# Patient Record
Sex: Female | Born: 1935 | Race: White | Hispanic: No | Marital: Married | Smoking: Never smoker
Health system: Southern US, Community
[De-identification: ages and names within clinical notes are randomized; demographics above are authoritative.]

## PROBLEM LIST (undated history)

## (undated) DIAGNOSIS — I10 Essential (primary) hypertension: Secondary | ICD-10-CM

## (undated) DIAGNOSIS — C50919 Malignant neoplasm of unspecified site of unspecified female breast: Secondary | ICD-10-CM

## (undated) DIAGNOSIS — E039 Hypothyroidism, unspecified: Secondary | ICD-10-CM

## (undated) DIAGNOSIS — C140 Malignant neoplasm of pharynx, unspecified: Secondary | ICD-10-CM

## (undated) DIAGNOSIS — C3490 Malignant neoplasm of unspecified part of unspecified bronchus or lung: Secondary | ICD-10-CM

## (undated) HISTORY — PX: MASTECTOMY: SHX3

## (undated) HISTORY — PX: BACK SURGERY: SHX140

## (undated) HISTORY — PX: PORTACATH PLACEMENT: SHX2246

## (undated) HISTORY — PX: OTHER SURGICAL HISTORY: SHX169

## (undated) HISTORY — PX: PORT-A-CATH REMOVAL: SHX5289

## (undated) HISTORY — DX: Malignant neoplasm of unspecified site of unspecified female breast: C50.919

## (undated) HISTORY — DX: Essential (primary) hypertension: I10

## (undated) HISTORY — DX: Malignant neoplasm of unspecified part of unspecified bronchus or lung: C34.90

## (undated) HISTORY — DX: Malignant neoplasm of pharynx, unspecified: C14.0

## (undated) HISTORY — DX: Hypothyroidism, unspecified: E03.9

---

## 2008-12-25 DIAGNOSIS — C140 Malignant neoplasm of pharynx, unspecified: Secondary | ICD-10-CM

## 2008-12-25 DIAGNOSIS — C50919 Malignant neoplasm of unspecified site of unspecified female breast: Secondary | ICD-10-CM

## 2008-12-25 HISTORY — DX: Malignant neoplasm of pharynx, unspecified: C14.0

## 2008-12-25 HISTORY — DX: Malignant neoplasm of unspecified site of unspecified female breast: C50.919

## 2010-12-25 DIAGNOSIS — C3490 Malignant neoplasm of unspecified part of unspecified bronchus or lung: Secondary | ICD-10-CM

## 2010-12-25 HISTORY — DX: Malignant neoplasm of unspecified part of unspecified bronchus or lung: C34.90

## 2013-07-15 ENCOUNTER — Ambulatory Visit (INDEPENDENT_AMBULATORY_CARE_PROVIDER_SITE_OTHER): Payer: Medicare Other | Admitting: Family Medicine

## 2013-07-15 ENCOUNTER — Ambulatory Visit: Payer: Self-pay | Admitting: Family Medicine

## 2013-07-15 ENCOUNTER — Encounter: Payer: Self-pay | Admitting: Family Medicine

## 2013-07-15 VITALS — BP 168/74 | HR 80 | Temp 98.7°F | Ht 63.5 in | Wt 101.2 lb

## 2013-07-15 DIAGNOSIS — C50919 Malignant neoplasm of unspecified site of unspecified female breast: Secondary | ICD-10-CM

## 2013-07-15 DIAGNOSIS — C50912 Malignant neoplasm of unspecified site of left female breast: Secondary | ICD-10-CM | POA: Insufficient documentation

## 2013-07-15 DIAGNOSIS — E039 Hypothyroidism, unspecified: Secondary | ICD-10-CM

## 2013-07-15 DIAGNOSIS — Z85819 Personal history of malignant neoplasm of unspecified site of lip, oral cavity, and pharynx: Secondary | ICD-10-CM

## 2013-07-15 DIAGNOSIS — Z853 Personal history of malignant neoplasm of breast: Secondary | ICD-10-CM

## 2013-07-15 DIAGNOSIS — C140 Malignant neoplasm of pharynx, unspecified: Secondary | ICD-10-CM

## 2013-07-15 DIAGNOSIS — I10 Essential (primary) hypertension: Secondary | ICD-10-CM | POA: Insufficient documentation

## 2013-07-15 MED ORDER — OXYCODONE HCL 5 MG/5ML PO SOLN: 5.0000 mg | ORAL | Status: DC | PRN

## 2013-07-15 NOTE — Progress Notes (Signed)
  Subjective:    Patient ID: Veronica Pierce, female    DOB: [birth year], 79 y.o.   MRN: [id number]  HPI  Very pleasant 79 yo female here to establish care with her daughter.  Recently moved here from [facility] with her husband to be closer to her daughter and grandchildren.  Cancer survivor- has had multiple cancers unfortunately but doing very well. In 2010, diagnosed with throat cancer.  Had 8 chemo treatments and 35 radiation treatments.  Did require PEG tub for short time.  Later in 2010, diagnosed with left breast CA- s/p mastectomy in 08/2009.  In 2011, "spots on her right lung" were found.  S.p 5 tx of radiation on her lung.  Was on Femara (remains on femara)- 2013- right breast CA, s/p right mastectomy.  Hypothyroidism- secondary to radiation- on synthroid 100 mcg daily.  Denies any symptoms of hypo or hyperthyroidism.  Last saw ENT in 03/2013.  BP elevated today- per pt, typically runs in 140s/80s.  Denies any HA, blurred vision, CP or SOB.  Has lost a few pounds with the stress of the move- has been maintaining weight around 107 lb.  Does drink ensure several times per day.  Wt Readings from Last 3 Encounters:  07/15/13 101 lb 4 oz (45.927 kg)   Patient Active Problem List   Diagnosis Date Noted  . Throat cancer   . Breast cancer   . Hypothyroidism   . HTN (hypertension)    Past Medical History  Diagnosis Date  . Throat cancer 2010  . Breast cancer 2010    left   . Lung cancer 2012  . Breast cancer 2013    right  . Hypothyroidism   . HTN (hypertension)    Past Surgical History  Procedure Laterality Date  . Mastectomy Bilateral     2010, 2013   History  Substance Use Topics  . Smoking status: Never Smoker   . Smokeless tobacco: Never Used  . Alcohol Use: No   Family History  Problem Relation Age of Onset  . Diabetes Mother   . Arthritis Father    No Known Allergies No current outpatient prescriptions on file prior to visit.   No current  facility-administered medications on file prior to visit.   The PMH, PSH, Social History, Family History, Medications, and allergies have been reviewed in [facility], and have been updated if relevant.   Review of Systems See HPI Denies any anxiety or depression- feels she is adjusting well No nausea, vomiting or diarrhea    Objective:   Physical Exam BP 168/74  Pulse 80  Temp(Src) 98.7 F (37.1 C) (Oral)  Ht 5' 3.5" (1.613 m)  Wt 101 lb 4 oz (45.927 kg)  BMI 17.65 kg/m2 Gen:  Alert, thin female in NAD Chest:  S/p bilateral mastectomy Resp:  CTA bilaterally CVS:  RRR Abd:  Soft, NT Ext:  No edema Psych:  Good eye contact.  Not anxious or depressed appearing.     Assessment & Plan:  1. History of throat cancer Has been doing well. Will refer to local ENT. - Ambulatory referral to Hematology / Oncology - Ambulatory referral to ENT  2. History of breast cancer Refer to Oncology. - Ambulatory referral to Hematology / Oncology  3. Throat cancer   4. Hypothyroidism Asymptomatic but has not had thyroid function checked in several months.  Will recheck today. - TSH - T4, Free  5. HTN (hypertension) Continue to monitor- likely white coat HTN.

## 2013-07-15 NOTE — Patient Instructions (Addendum)
It was nice to meet you.  Please stop by to see Shirlee Limerick on your way out to set up your referrals. We will call you with your thyroid lab results.

## 2013-07-16 LAB — TSH: TSH: 3.02 u[IU]/mL (ref 0.35–5.50)

## 2013-07-28 ENCOUNTER — Telehealth: Payer: Self-pay | Admitting: *Deleted

## 2013-07-28 NOTE — Telephone Encounter (Signed)
Pt's daughter Eunice Blase) called stating that her mom has moved here and she would like to establish care with Dr. Welton Flakes.  Informed her that we need to obtain records from her previous oncologist and she gave me that facilities information.  I told her that I would call and see what I could do and get back with her.   Called and left a message for Elease Hashimoto in Medical Records at [facility] at [phone] requesting for her to please fax records and if she had any questions, to please call me.  Called Pts daughter Eunice Blase) back to make her aware.

## 2013-07-29 ENCOUNTER — Telehealth: Payer: Self-pay | Admitting: *Deleted

## 2013-07-29 NOTE — Telephone Encounter (Signed)
Received records and spoke with pt's daughter Eunice Blase) and confirmed 08/22/13 appt w/ her.  Mailed before appt letter & packet to pt.  Took paperwork to Med Rec for chart.

## 2013-08-22 ENCOUNTER — Ambulatory Visit (HOSPITAL_BASED_OUTPATIENT_CLINIC_OR_DEPARTMENT_OTHER): Payer: Medicare Other | Admitting: Oncology

## 2013-08-22 ENCOUNTER — Ambulatory Visit: Payer: Medicare Other

## 2013-08-22 ENCOUNTER — Other Ambulatory Visit (HOSPITAL_BASED_OUTPATIENT_CLINIC_OR_DEPARTMENT_OTHER): Payer: Medicare Other

## 2013-08-22 ENCOUNTER — Other Ambulatory Visit: Payer: Self-pay | Admitting: Emergency Medicine

## 2013-08-22 ENCOUNTER — Encounter: Payer: Self-pay | Admitting: Oncology

## 2013-08-22 ENCOUNTER — Other Ambulatory Visit: Payer: Self-pay | Admitting: *Deleted

## 2013-08-22 ENCOUNTER — Telehealth: Payer: Self-pay | Admitting: *Deleted

## 2013-08-22 VITALS — BP 186/72 | HR 90 | Temp 97.8°F | Resp 20 | Ht 63.5 in | Wt 102.8 lb

## 2013-08-22 DIAGNOSIS — C50911 Malignant neoplasm of unspecified site of right female breast: Secondary | ICD-10-CM

## 2013-08-22 DIAGNOSIS — C50919 Malignant neoplasm of unspecified site of unspecified female breast: Secondary | ICD-10-CM

## 2013-08-22 DIAGNOSIS — E559 Vitamin D deficiency, unspecified: Secondary | ICD-10-CM

## 2013-08-22 DIAGNOSIS — C50411 Malignant neoplasm of upper-outer quadrant of right female breast: Secondary | ICD-10-CM

## 2013-08-22 DIAGNOSIS — C50419 Malignant neoplasm of upper-outer quadrant of unspecified female breast: Secondary | ICD-10-CM

## 2013-08-22 DIAGNOSIS — M810 Age-related osteoporosis without current pathological fracture: Secondary | ICD-10-CM

## 2013-08-22 DIAGNOSIS — Z170 Estrogen receptor positive status [ER+]: Secondary | ICD-10-CM

## 2013-08-22 DIAGNOSIS — Z85819 Personal history of malignant neoplasm of unspecified site of lip, oral cavity, and pharynx: Secondary | ICD-10-CM

## 2013-08-22 LAB — CBC WITH DIFFERENTIAL/PLATELET
Basophils Absolute: 0 10*3/uL (ref 0.0–0.1)
EOS%: 1.8 % (ref 0.0–7.0)
HGB: 13.1 g/dL (ref 11.6–15.9)
MCH: 31.2 pg (ref 25.1–34.0)
MCV: 93.6 fL (ref 79.5–101.0)
MONO%: 6.3 % (ref 0.0–14.0)
RBC: 4.21 10*6/uL (ref 3.70–5.45)
RDW: 13.3 % (ref 11.2–14.5)

## 2013-08-22 LAB — COMPREHENSIVE METABOLIC PANEL (CC13)
ALT: 12 U/L (ref 0–55)
CO2: 25 mEq/L (ref 22–29)
Calcium: 9.5 mg/dL (ref 8.4–10.4)
Glucose: 182 mg/dl — ABNORMAL HIGH (ref 70–140)
Potassium: 4.2 mEq/L (ref 3.5–5.1)
Total Protein: 7.1 g/dL (ref 6.4–8.3)

## 2013-08-22 MED ORDER — ALENDRONATE SODIUM 35 MG PO TABS: 35.0000 mg | ORAL_TABLET | ORAL | Status: DC

## 2013-08-22 NOTE — Patient Instructions (Addendum)
Doing well  Continue letrozole 2.5 mg daily  Fosamax once a week  Alendronate weekly tablets What is this medicine? ALENDRONATE (a LEN droe nate) slows calcium loss from bones. It helps to make healthy bone and to slow bone loss in people with osteoporosis. It may be used to treat Paget's disease. This medicine may be used for other purposes; ask your health care provider or pharmacist if you have questions. What should I tell my health care provider before I take this medicine? They need to know if you have any of these conditions: -esophagus, stomach, or intestine problems, like acid-reflux or GERD -dental disease -kidney disease -low blood calcium -low vitamin D -problems swallowing -problems sitting or standing for 30 minutes -an unusual or allergic reaction to alendronate, other medicines, foods, dyes, or preservatives -pregnant or trying to get pregnant -breast-feeding How should I use this medicine? You must take this medicine exactly as directed or you will lower the amount of medicine you absorb into your body or you may cause yourself harm. Take your dose by mouth first thing in the morning, after you are up for the day. Do not eat or drink anything before you take this medicine. Swallow your medicine with a full glass (6 to 8 fluid ounces) of plain water. Do not take this tablet with any other drink. Do not chew or crush the tablet. After taking this medicine, do not eat breakfast, drink, or take any medicines or vitamins for at least 30 minutes. Stand or sit up for at least 30 minutes after you take this medicine; do not lie down. Take this medicine on the same day every week. Do not take your medicine more often than directed. Talk to your pediatrician regarding the use of this medicine in children. Special care may be needed. Overdosage: If you think you have taken too much of this medicine contact a poison control center or emergency room at once. NOTE: This medicine is only  for you. Do not share this medicine with others. What if I miss a dose? If you miss a dose, take the dose on the morning after you remember. Then take your next dose on your regular day of the week. Never take 2 tablets on the same day. Do not take double or extra doses. What may interact with this medicine? -aluminum hydroxide -antacids -aspirin -calcium supplements -drugs for inflammation like ibuprofen, naproxen, and others -iron supplements -magnesium supplements -vitamins with minerals This list may not describe all possible interactions. Give your health care provider a list of all the medicines, herbs, non-prescription drugs, or dietary supplements you use. Also tell them if you smoke, drink alcohol, or use illegal drugs. Some items may interact with your medicine. What should I watch for while using this medicine? Visit your doctor or health care professional for regular checks ups. It may be some time before you see benefit from this medicine. Do not stop taking your medication except on your doctor's advice. Your doctor or health care professional may order blood tests and other tests to see how you are doing. You should make sure you get enough calcium and vitamin D while you are taking this medicine, unless your doctor tells you not to. Discuss the foods you eat and the vitamins you take with your health care professional. Some people who take this medicine have severe bone, joint, and/or muscle pain. This medicine may also increase your risk for a broken thigh bone. Tell your doctor right away if you  have pain in your upper leg or groin. Tell your doctor if you have any pain that does not go away or that gets worse. This medicine can make you more sensitive to the sun. If you get a rash while taking this medicine, sunlight may cause the rash to get worse. Keep out of the sun. If you cannot avoid being in the sun, wear protective clothing and use sunscreen. Do not use sun lamps or  tanning beds/booths. What side effects may I notice from receiving this medicine? Side effects that you should report to your doctor or health care professional as soon as possible: -allergic reactions like skin rash, itching or hives, swelling of the face, lips, or tongue -black or tarry stools -bone, muscle or joint pain -changes in vision -chest pain -heartburn or stomach pain -jaw pain, especially after dental work -pain or trouble when swallowing -redness, blistering, peeling or loosening of the skin, including inside the mouth Side effects that usually do not require medical attention (report to your doctor or health care professional if they continue or are bothersome): -changes in taste -diarrhea or constipation -eye pain or itching -headache -nausea or vomiting -stomach gas or fullness This list may not describe all possible side effects. Call your doctor for medical advice about side effects. You may report side effects to FDA at 1-800-FDA-1088. Where should I keep my medicine? Keep out of the reach of children. Store at room temperature of 15 and 30 degrees C (59 and 86 degrees F). Throw away any unused medicine after the expiration date. NOTE: This sheet is a summary. It may not cover all possible information. If you have questions about this medicine, talk to your doctor, pharmacist, or health care provider.  2013, Elsevier/Gold Standard. (10/26/2011 9:02:42 AM)   Letrozole tablets What is this medicine? LETROZOLE (LET roe zole) blocks the production of estrogen. Certain types of breast cancer grow under the influence of estrogen. Letrozole helps block tumor growth. This medicine is used to treat advanced breast cancer in postmenopausal women. This medicine may be used for other purposes; ask your health care provider or pharmacist if you have questions. What should I tell my health care provider before I take this medicine? They need to know if you have any of these  conditions: -liver disease -osteoporosis (weak bones) -an unusual or allergic reaction to letrozole, other medicines, foods, dyes, or preservatives -pregnant or trying to get pregnant -breast-feeding How should I use this medicine? Take this medicine by mouth with a glass of water. You may take it with or without food. Follow the directions on the prescription label. Take your medicine at regular intervals. Do not take your medicine more often than directed. Do not stop taking except on your doctor's advice. Talk to your pediatrician regarding the use of this medicine in children. Special care may be needed. Overdosage: If you think you have taken too much of this medicine contact a poison control center or emergency room at once. NOTE: This medicine is only for you. Do not share this medicine with others. What if I miss a dose? If you miss a dose, take it as soon as you can. If it is almost time for your next dose, take only that dose. Do not take double or extra doses. What may interact with this medicine? Do not take this medicine with any of the following medications: -estrogens, like hormone replacement therapy or birth control pills This medicine may also interact with the following medications: -dietary  supplements such as androstenedione or DHEA -prasterone -tamoxifen This list may not describe all possible interactions. Give your health care provider a list of all the medicines, herbs, non-prescription drugs, or dietary supplements you use. Also tell them if you smoke, drink alcohol, or use illegal drugs. Some items may interact with your medicine. What should I watch for while using this medicine? Visit your doctor or health care professional for regular check-ups to monitor your condition. Do not use this drug if you are pregnant. Serious side effects to an unborn child are possible. Talk to your doctor or pharmacist for more information. You may get drowsy or dizzy. Do not drive,  use machinery, or do anything that needs mental alertness until you know how this medicine affects you. Do not stand or sit up quickly, especially if you are an older patient. This reduces the risk of dizzy or fainting spells. What side effects may I notice from receiving this medicine? Side effects that you should report to your doctor or health care professional as soon as possible: -allergic reactions like skin rash, itching, or hives -bone fracture -chest pain -difficulty breathing or shortness of breath -severe pain, swelling, warmth in the leg -unusually weak or tired -vaginal bleeding Side effects that usually do not require medical attention (report to your doctor or health care professional if they continue or are bothersome): -bone, back, joint, or muscle pain -dizziness -fatigue -fluid retention -headache -hot flashes, night sweats -nausea -weight gain This list may not describe all possible side effects. Call your doctor for medical advice about side effects. You may report side effects to FDA at 1-800-FDA-1088. Where should I keep my medicine? Keep out of the reach of children. Store between 15 and 30 degrees C (59 and 86 degrees F). Throw away any unused medicine after the expiration date. NOTE: This sheet is a summary. It may not cover all possible information. If you have questions about this medicine, talk to your doctor, pharmacist, or health care provider.  2012, Elsevier/Gold Standard. (02/21/2008 4:43:44 PM)

## 2013-08-22 NOTE — Telephone Encounter (Signed)
appts made and printed...td 

## 2013-08-22 NOTE — Progress Notes (Signed)
Checked in new patient with no financial issues. Didn't ask if living will/poa. She wants mail and phone only for communication.

## 2013-09-07 NOTE — Progress Notes (Signed)
Veronica Pierce [id number] [birth year] 79 y.o. 09/07/2013 1:00 AM  CC  Veronica Mannan, MD [address] [place] [address]  REASON FOR CONSULTATION:  79 year old female with multiple malignancies including squamous cell carcinoma of the head and neck, breast cancer. 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 Ruthe Pierce  HISTORY OF PRESENT ILLNESS:  Veronica Pierce is a 79 y.o. female.  As oncologic history dates back to February March 2010 when she developed a lump in her right neck. She also noticed hoarseness and difficulty with having to clear her throat. She was seen at primary care physician's office and was initially given antibiotics with some improvement but it ultimately the symptoms did not resolve. She was seen at a [facility] by Dr. Lilian Kapur who performed an examination. She was found to have a tumor in the right pharyngeal wall extending to the larynx with fixation of the right vocal cord approximately 2 cm in size with a fixed mass in the right upper neck. Patient had a biopsy performed on 05/07/2009. She was found to have moderately differentiated squamous cell carcinoma involving the right. aryepiglottic fold,) she'll wall and epiglottis. 05/27/2009 patient was started on weekly cis-platinum with radiation on 06/10/2009 -07/29/2009.Patient also had a screening mammogram on 07/23/2009 which showed evidence of  Abnormality in the left breast.after  completing her cis-platinum she went on to have further imaging studies of the breasts performed on 08/04/2009. This showed a highly suspicious abnormality in the left breast. She underwent FNA of left breast mass on 08/10/2009. The pathology was suspicious for breast cancer.  On 08/26/2009 patient had a mastectomy performed with the final pathology revealing a 2 cm tumor was well differentiated infiltrating ductal carcinoma with extensive calcifications. DCIS component comprised approximately 40%. There was no angiolymphatic invasion. All margins were negative. Sentinel nodes were negative for metastatic disease tumor was estrogen receptor positive at 80% progesterone receptor +10% and HER-2/neu by fish negative. She had an Oncotype DX testing performed which showed an elevated Oncotype score with a recurrence score of 41 with an associated risk of recurrence of 28% with tamoxifen alone. She had staging studies performed that showed no evidence of metastatic disease. On 10/29/2009 patient began chemotherapy consisting of Taxotere and Cytoxan she received a total of 4 cycles last cycle was on 01/03/2010. She was then begun on aromatase inhibitor consisting of Arimidex 1 mg daily. Patient also had a bone density scan performed which showed osteoporosis she was recommended 3 class but could not receive it due to to insurance issues. On April 2012 patient had restaging CT performed that showed evidence of pulmonary nodules in the right lung PET scan on 04/09/2011 showed increased activity. 05/23/2011 it was recommended that patient receive radiosurgery for the lung lesion. She was referred to radiation in [place] on 06/26/2011. After getting treated she had a followup PET scan performed that showed resolution of the lesion in the right base and right upper lobe of the lung. 03/06/2012 patient had a mammogram performed that showed a 1.5 cm  palpable suspicious mass in the outer aspect of the breast. She had a biopsy  performed on 03/12/2012 that was consistent with infiltrating carcinoma with desmoplastic response. She had a simple mastectomy on 04/11/2012 with sentinel lymph node biopsy. The final pathology showed a 1.2 cm infiltrating ductal carcinoma tumor was ER negative PR negative and HER-2/neu negative. She did not receive any further chemotherapy and was observed only last PET scan performed on 12/01/2012 showed no evidence of malignancy recurrence but there was evidence of new L1 compression fracture. She underwent a kyphoplasty procedure with improvement of her back. Past Medical History: Past Medical History  Diagnosis Date  . Throat cancer 2010  . Breast cancer 2010    left   . Lung cancer 2012  . Breast cancer 2013    right  . Hypothyroidism   . HTN (hypertension)     Past Surgical History: Past Surgical History  Procedure Laterality Date  . Mastectomy Bilateral     2010, 2013    Family History: Family History  Problem Relation Age of Onset  . Diabetes Mother   . Arthritis Father     Social History History  Substance Use Topics  . Smoking status: Never Smoker   . Smokeless tobacco: Never Used  . Alcohol Use: No    Allergies: No Known Allergies  Current Medications: Current Outpatient Prescriptions  Medication Sig Dispense Refill  . alendronate (FOSAMAX) 35 MG tablet Take 1 tablet (35 mg total) by mouth every 7 (seven) days. Take with a full glass of water on an empty stomach.  12 tablet  6  . calcium gluconate 500 MG tablet Take 1,000 mg by mouth daily.      Marland Kitchen letrozole (FEMARA) 2.5 MG tablet Take 2.5 mg by mouth daily.      Marland Kitchen levothyroxine (SYNTHROID, LEVOTHROID) 100 MCG tablet Take 100 mcg by mouth daily before breakfast.      . oxyCODONE (ROXICODONE) 5 MG/5ML solution Take 5 mLs (5 mg total) by mouth every 4 (four) hours as needed for pain.  100 mL  0  . pilocarpine (SALAGEN) 5 MG tablet Take 5 mg by mouth every 12 (twelve) hours.      Marland Kitchen guaifenesin (MUCUS RELIEF)  400 MG TABS Take 400 mg by mouth as needed.       No current facility-administered medications for this visit.    OB/GYN History:menarche at age 73 patient is postmenopausal no history of hormone replacement therapy first live birth at 28  Fertility Discussion:not applicable Prior History of Cancer: yes see history of present illness  Health Maintenance:  Colonoscopy no Bone Density yes 2011 Last PAP smear unknown  ECOG PERFORMANCE STATUS: 0 - Asymptomatic  Genetic Counseling/testing: no  REVIEW OF SYSTEMS:  Constitutional: positive for fatigue Ears, nose, mouth, throat, and face: positive for hoarseness Respiratory: positive for dyspnea on exertion Cardiovascular: negative Gastrointestinal: positive for dysphagia Genitourinary:negative Integument/breast: positive for Bilateral mastectomy Hematologic/lymphatic: negative Musculoskeletal:negative Neurological: negative Behavioral/Psych: negative  PHYSICAL EXAMINATION: Blood pressure 186/72, pulse 90, temperature 97.8 F (36.6 C), temperature source Oral, resp. rate 20, height 5' 3.5" (1.613 m), weight 102 lb 12.8 oz (46.63 kg).  ZOX:WRUEA, healthy, no distress and well developed SKIN: skin color, texture, turgor are normal HEAD: Normocephalic EYES: PERRLA, EOMI, Conjunctiva are pink and non-injected EARS: External ears normal OROPHARYNX:no exudate, no erythema and lips, buccal mucosa, and tongue normal  NECK: supple, no adenopathy, thyroid normal size, non-tender, without nodularity LYMPH:  no palpable lymphadenopathy BREAST:patient has had bilateral  mastectomies LUNGS: clear to auscultation and percussion HEART: regular rate & rhythm ABDOMEN:abdomen soft, non-tender, normal bowel sounds and no masses or organomegaly BACK: Back symmetric, no curvature., No CVA tenderness, Range of motion is normal EXTREMITIES:less then 2 second capillary refill, no edema, no clubbing, no cyanosis  NEURO: alert & oriented x 3 with  fluent speech, no focal motor/sensory deficits, gait normal     STUDIES/RESULTS: No results found.   LABS:    Chemistry      Component Value Date/Time   NA 138 08/22/2013 1236   K 4.2 08/22/2013 1236   CO2 25 08/22/2013 1236   BUN 18.7 08/22/2013 1236   CREATININE 0.8 08/22/2013 1236      Component Value Date/Time   CALCIUM 9.5 08/22/2013 1236   ALKPHOS 59 08/22/2013 1236   AST 19 08/22/2013 1236   ALT 12 08/22/2013 1236   BILITOT 0.42 08/22/2013 1236      Lab Results  Component Value Date   WBC 6.7 08/22/2013   HGB 13.1 08/22/2013   HCT 39.4 08/22/2013   MCV 93.6 08/22/2013   PLT 149 08/22/2013       PATHOLOGY:  ASSESSMENT    79 year old female with  #1 history of head and neck cancer status post concurrent radiation and chemotherapy originally diagnosed in 2010.  #2 diagnosis of left breast cancer infiltrating ductal ER positive PR positive HER-2/neu negative on letrozole 2.5 mg daily. Patient did receive adjuvant chemotherapy consisting of Taxotere and Cytoxan x4 cycles for a high risk Oncotype DX.  #3 right breast cancer at measuring 1.2 cm that was ER negative PR negative HER-2/neu negative status post mastectomy on observation only  #4 history of osteoporosis   #5 right pubic ramus fracture  #6 pulmonary nodule identified in April 2012 biopsied in May 2012 consistent with undifferentiated carcinoma status post radiosurgery  #7 L1 compression fracture diagnosed December 2013  Clinical Trial Eligibility: no Multidisciplinary conference discussion no     PLAN:    #1 we will continueletrozole 2.5 mg daily.  #2 osteoporosis: Patient will continue Fosamax once a week  #3 patient will be seen back in 6 months time for followup     Discussion: Patient is being treated per NCCN breast cancer care guidelines appropriate for stage.II and I breast cancer   Thank you so much for allowing me to participate in the care of Laycie Schriner. I will continue to follow up  the patient with you and assist in her care.  All questions were answered. The patient knows to call the clinic with any problems, questions or concerns. We can certainly see the patient much sooner if necessary.  I spent 60 minutes counseling the patient face to face. The total time spent in the appointment was 60 minutes.  Drue Second, MD Medical/Oncology [facility] [phone] (beeper) [phone] (Office)

## 2013-10-14 ENCOUNTER — Other Ambulatory Visit: Payer: Self-pay | Admitting: Family Medicine

## 2013-10-16 ENCOUNTER — Other Ambulatory Visit: Payer: Self-pay | Admitting: Emergency Medicine

## 2013-10-16 MED ORDER — LETROZOLE 2.5 MG PO TABS: 2.5000 mg | ORAL_TABLET | Freq: Every day | ORAL | Status: DC

## 2013-10-17 ENCOUNTER — Encounter: Payer: Self-pay | Admitting: *Deleted

## 2013-11-28 ENCOUNTER — Other Ambulatory Visit (HOSPITAL_BASED_OUTPATIENT_CLINIC_OR_DEPARTMENT_OTHER): Payer: Medicare Other | Admitting: Lab

## 2013-11-28 ENCOUNTER — Ambulatory Visit (HOSPITAL_BASED_OUTPATIENT_CLINIC_OR_DEPARTMENT_OTHER): Payer: Medicare Other | Admitting: Oncology

## 2013-11-28 ENCOUNTER — Encounter: Payer: Self-pay | Admitting: Oncology

## 2013-11-28 ENCOUNTER — Telehealth: Payer: Self-pay | Admitting: Oncology

## 2013-11-28 VITALS — BP 175/77 | HR 97 | Temp 97.1°F | Resp 18 | Ht 63.5 in | Wt 101.1 lb

## 2013-11-28 DIAGNOSIS — C760 Malignant neoplasm of head, face and neck: Secondary | ICD-10-CM

## 2013-11-28 DIAGNOSIS — C50911 Malignant neoplasm of unspecified site of right female breast: Secondary | ICD-10-CM

## 2013-11-28 DIAGNOSIS — C50919 Malignant neoplasm of unspecified site of unspecified female breast: Secondary | ICD-10-CM

## 2013-11-28 DIAGNOSIS — R918 Other nonspecific abnormal finding of lung field: Secondary | ICD-10-CM

## 2013-11-28 DIAGNOSIS — R911 Solitary pulmonary nodule: Secondary | ICD-10-CM

## 2013-11-28 DIAGNOSIS — C50912 Malignant neoplasm of unspecified site of left female breast: Secondary | ICD-10-CM

## 2013-11-28 DIAGNOSIS — M810 Age-related osteoporosis without current pathological fracture: Secondary | ICD-10-CM

## 2013-11-28 LAB — COMPREHENSIVE METABOLIC PANEL (CC13)
ALT: 12 U/L (ref 0–55)
Albumin: 3.7 g/dL (ref 3.5–5.0)
Anion Gap: 9 mEq/L (ref 3–11)
CO2: 28 mEq/L (ref 22–29)
Calcium: 9.9 mg/dL (ref 8.4–10.4)
Chloride: 103 mEq/L (ref 98–109)
Glucose: 139 mg/dl (ref 70–140)
Potassium: 4.4 mEq/L (ref 3.5–5.1)
Sodium: 141 mEq/L (ref 136–145)
Total Bilirubin: 0.48 mg/dL (ref 0.20–1.20)
Total Protein: 7.3 g/dL (ref 6.4–8.3)

## 2013-11-28 LAB — CBC WITH DIFFERENTIAL/PLATELET
BASO%: 0.5 % (ref 0.0–2.0)
Eosinophils Absolute: 0.1 10*3/uL (ref 0.0–0.5)
HCT: 39.2 % (ref 34.8–46.6)
LYMPH%: 16.3 % (ref 14.0–49.7)
MCH: 32 pg (ref 25.1–34.0)
MCV: 95.3 fL (ref 79.5–101.0)
MONO#: 0.4 10*3/uL (ref 0.1–0.9)
MONO%: 8.3 % (ref 0.0–14.0)
NEUT#: 3.8 10*3/uL (ref 1.5–6.5)
Platelets: 156 10*3/uL (ref 145–400)
RBC: 4.11 10*6/uL (ref 3.70–5.45)
RDW: 13.4 % (ref 11.2–14.5)
WBC: 5.2 10*3/uL (ref 3.9–10.3)
lymph#: 0.8 10*3/uL — ABNORMAL LOW (ref 0.9–3.3)

## 2013-11-28 MED ORDER — OXYCODONE HCL 5 MG/5ML PO SOLN: 5.0000 mg | ORAL | Status: DC | PRN

## 2013-11-28 NOTE — Telephone Encounter (Signed)
, °

## 2013-11-28 NOTE — Progress Notes (Signed)
Veronica Pierce [id number] 1931/03/27 79 y.o. 11/28/2013 11:15 AM  CC  [name], MD [address] [place] [address]  Diagnosis:  79 year old female with multiple malignancies including squamous cell carcinoma of the head and neck, breast cancer. 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 [name]  Prior Oncologic history:  Veronica Pierce is a 79 y.o. female.    #1 As oncologic history dates back to February March 2010 when she developed a lump in her right neck. She also noticed hoarseness and difficulty with having to clear her throat. She was seen at primary care physician's office and was initially given antibiotics with some improvement but it ultimately the symptoms did not resolve. She was seen at a postal [facility] by Dr. Lilian Kapur who performed an examination. She was found to have a tumor in the right pharyngeal wall extending to the larynx with fixation of the right vocal cord approximately 2 cm in size with a fixed mass in the right upper neck. Patient had a biopsy performed on 05/07/2009. She was found to have moderately differentiated squamous cell carcinoma involving the right. aryepiglottic fold,) she'll wall and epiglottis. 05/27/2009 patient was started on weekly cis-platinum with radiation on 06/10/2009 -07/29/2009.  #2 Patient also had a screening mammogram on 07/23/2009 which showed evidence of  Abnormality in the left breast.after  completing her cis-platinum she went on to have further imaging studies of the breasts performed on 08/04/2009. This showed a highly suspicious abnormality in the left breast. She underwent FNA of left breast mass on 08/10/2009. The pathology was suspicious for breast cancer.  On 08/26/2009 patient had a mastectomy performed with the final pathology revealing a 2 cm tumor was well differentiated infiltrating ductal carcinoma with extensive calcifications. DCIS component comprised approximately 40%. There was no angiolymphatic invasion. All margins were negative. Sentinel nodes were negative for metastatic disease tumor was estrogen receptor positive at 80% progesterone receptor +10% and HER-2/neu by fish negative.   #3 She had an Oncotype DX testing performed which showed an elevated Oncotype score with a recurrence score of 41 with an associated risk of recurrence of 28% with tamoxifen alone. She had staging studies performed that showed no evidence of metastatic disease. On 10/29/2009 patient began chemotherapy consisting of Taxotere and Cytoxan she received a total of 4 cycles last cycle was on 01/03/2010. She was then begun on aromatase inhibitor consisting of Arimidex 1 mg daily. Patient also had a bone density scan performed which showed osteoporosis she was recommended 3 class but could not receive it due to to insurance issues.   #3 On April 2012 patient had restaging CT performed that showed evidence of pulmonary nodules in the right lung PET scan on 04/09/2011 showed increased activity. 05/23/2011 it was recommended that patient receive radiosurgery for the lung lesion. She was referred to radiation in [place] on 06/26/2011. After getting treated she had a followup PET scan performed that showed resolution of the lesion in the right base and right upper lobe of the lung.   #  4 03/06/2012 patient had a mammogram performed that showed a 1.5 cm palpable suspicious mass in the outer aspect of the  breast. She had a biopsy performed on 03/12/2012 that was consistent with infiltrating carcinoma with desmoplastic response. She had a simple mastectomy on 04/11/2012 with sentinel lymph node biopsy. The final pathology showed a 1.2 cm infiltrating ductal carcinoma tumor was ER negative PR negative and HER-2/neu negative. She did not receive any further chemotherapy and was observed only last PET scan performed on 12/01/2012 showed no evidence of malignancy recurrence but there was evidence of new L1 compression fracture. She underwent a kyphoplasty procedure with improvement of her back.  Current therapy: Letrozole 2.5 mg daily.  Interval history: Patient is seen in followup today. Overall she looks terrific. She is scheduled into her house and [place]. She is very happy with the move. She denies any headaches double vision blurring of vision fevers chills or night sweats. No nausea or vomiting no myalgias and arthralgias. Remainder of the 10 point review of systems is negative.  Past Medical History: Past Medical History  Diagnosis Date  . Throat cancer 2010  . Breast cancer 2010    left   . Lung cancer 2012  . Breast cancer 2013    right  . Hypothyroidism   . HTN (hypertension)     Past Surgical History: Past Surgical History  Procedure Laterality Date  . Mastectomy Bilateral     2010, 2013    Family History: Family History  Problem Relation Age of Onset  . Diabetes Mother   . Arthritis Father     Social History History  Substance Use Topics  . Smoking status: Never Smoker   . Smokeless tobacco: Never Used  . Alcohol Use: No    Allergies: No Known Allergies  Current Medications: Current Outpatient Prescriptions  Medication Sig Dispense Refill  . alendronate (FOSAMAX) 35 MG tablet Take 1 tablet (35 mg total) by mouth every 7 (seven) days. Take with a full glass of water on an empty stomach.  12 tablet  6  . calcium gluconate 500 MG tablet Take 1,000 mg by mouth  daily.      Marland Kitchen letrozole (FEMARA) 2.5 MG tablet Take 1 tablet (2.5 mg total) by mouth daily.  90 tablet  4  . levothyroxine (SYNTHROID, LEVOTHROID) 100 MCG tablet TAKE 1 TABLET EVERY DAY  30 tablet  11  . oxyCODONE (ROXICODONE) 5 MG/5ML solution Take 5 mLs (5 mg total) by mouth every 4 (four) hours as needed for pain.  100 mL  0  . pilocarpine (SALAGEN) 5 MG tablet Take 5 mg by mouth every 12 (twelve) hours.      Marland Kitchen guaifenesin (MUCUS RELIEF) 400 MG TABS Take 400 mg by mouth as needed.       No current facility-administered medications for this visit.    OB/GYN History:menarche at age 89 patient is postmenopausal no history of hormone replacement therapy first live birth at 65  Fertility Discussion:not applicable Prior History of Cancer: yes see history of present illness  Health Maintenance:  Colonoscopy no Bone Density yes 2011 Last PAP smear unknown  ECOG PERFORMANCE STATUS: 0 - Asymptomatic  Genetic Counseling/testing: no  REVIEW OF SYSTEMS:  Constitutional: positive for fatigue Ears, nose, mouth, throat, and face: positive for hoarseness Respiratory: positive for dyspnea on exertion Cardiovascular: negative Gastrointestinal: positive for dysphagia Genitourinary:negative Integument/breast: positive for Bilateral mastectomy Hematologic/lymphatic: negative Musculoskeletal:negative Neurological: negative Behavioral/Psych: negative  PHYSICAL EXAMINATION: Blood pressure 175/77, pulse 97, temperature  97.1 F (36.2 C), temperature source Oral, resp. rate 18, height 5' 3.5" (1.613 m), weight 101 lb 1 oz (45.842 kg).  VOZ:DGUYQ, healthy, no distress and well developed SKIN: skin color, texture, turgor are normal HEAD: Normocephalic EYES: PERRLA, EOMI, Conjunctiva are pink and non-injected EARS: External ears normal OROPHARYNX:no exudate, no erythema and lips, buccal mucosa, and tongue normal  NECK: supple, no adenopathy, thyroid normal size, non-tender, without  nodularity LYMPH:  no palpable lymphadenopathy BREAST:patient has had bilateral mastectomies LUNGS: clear to auscultation and percussion HEART: regular rate & rhythm ABDOMEN:abdomen soft, non-tender, normal bowel sounds and no masses or organomegaly BACK: Back symmetric, no curvature., No CVA tenderness, Range of motion is normal EXTREMITIES:less then 2 second capillary refill, no edema, no clubbing, no cyanosis  NEURO: alert & oriented x 3 with fluent speech, no focal motor/sensory deficits, gait normal     STUDIES/RESULTS: No results found.   LABS:    Chemistry      Component Value Date/Time   NA 141 11/28/2013 0959   K 4.4 11/28/2013 0959   CO2 28 11/28/2013 0959   BUN 19.7 11/28/2013 0959   CREATININE 0.8 11/28/2013 0959      Component Value Date/Time   CALCIUM 9.9 11/28/2013 0959   ALKPHOS 60 11/28/2013 0959   AST 21 11/28/2013 0959   ALT 12 11/28/2013 0959   BILITOT 0.48 11/28/2013 0959      Lab Results  Component Value Date   WBC 5.2 11/28/2013   HGB 13.2 11/28/2013   HCT 39.2 11/28/2013   MCV 95.3 11/28/2013   PLT 156 11/28/2013    ASSESSMENT    79 year old female with  #1 history of head and neck cancer status post concurrent radiation and chemotherapy originally diagnosed in 2010.  #2 diagnosis of left breast cancer infiltrating ductal ER positive PR positive HER-2/neu negative on letrozole 2.5 mg daily. Patient did receive adjuvant chemotherapy consisting of Taxotere and Cytoxan x4 cycles for a high risk Oncotype DX.  #3 right breast cancer at measuring 1.2 cm that was ER negative PR negative HER-2/neu negative status post mastectomy on observation only  #4 history of osteoporosis   #5 right pubic ramus fracture  #6 pulmonary nodule identified in April 2012 biopsied in May 2012 consistent with undifferentiated carcinoma status post radiosurgery  #7 L1 compression fracture diagnosed December 2013   PLAN:    #1 we will continue letrozole 2.5 mg  daily.  #2 osteoporosis: Patient will continue Fosamax once a week  #3 patient with half CT of the chest with contrast performed as soon as possible to follow up of her pulmonary nodules. If there is any evidence of recurrent disease and we'll plan on doing further staging studies.  #4 patient was given a prescription for oxycodone today as well.  #5 patient will be seen back in 6 months time for followup      Thank you so much for allowing me to participate in the care of Ayo Guarino. I will continue to follow up the patient with you and assist in her care.  All questions were answered. The patient knows to call the clinic with any problems, questions or concerns. We can certainly see the patient much sooner if necessary.  I spent 20 minutes counseling the patient face to face. The total time spent in the appointment was 30 minutes.  Drue Second, MD Medical/Oncology [facility] [phone] (beeper) [phone] (Office)

## 2013-12-02 ENCOUNTER — Ambulatory Visit (HOSPITAL_COMMUNITY)
Admission: RE | Admit: 2013-12-02 | Discharge: 2013-12-02 | Disposition: A | Discharge status: Home / Self Care | Payer: Medicare Other | Source: Ambulatory Visit | Attending: Oncology | Admitting: Oncology

## 2013-12-02 ENCOUNTER — Encounter (HOSPITAL_COMMUNITY): Payer: Self-pay

## 2013-12-02 DIAGNOSIS — M439 Deforming dorsopathy, unspecified: Secondary | ICD-10-CM | POA: Insufficient documentation

## 2013-12-02 DIAGNOSIS — J90 Pleural effusion, not elsewhere classified: Secondary | ICD-10-CM | POA: Insufficient documentation

## 2013-12-02 DIAGNOSIS — Z8589 Personal history of malignant neoplasm of other organs and systems: Secondary | ICD-10-CM | POA: Insufficient documentation

## 2013-12-02 DIAGNOSIS — I700 Atherosclerosis of aorta: Secondary | ICD-10-CM | POA: Insufficient documentation

## 2013-12-02 DIAGNOSIS — D739 Disease of spleen, unspecified: Secondary | ICD-10-CM | POA: Insufficient documentation

## 2013-12-02 DIAGNOSIS — R918 Other nonspecific abnormal finding of lung field: Secondary | ICD-10-CM

## 2013-12-02 DIAGNOSIS — Z9010 Acquired absence of unspecified breast and nipple: Secondary | ICD-10-CM | POA: Insufficient documentation

## 2013-12-02 DIAGNOSIS — J984 Other disorders of lung: Secondary | ICD-10-CM | POA: Insufficient documentation

## 2013-12-02 DIAGNOSIS — C50919 Malignant neoplasm of unspecified site of unspecified female breast: Secondary | ICD-10-CM | POA: Insufficient documentation

## 2013-12-02 MED ORDER — IOHEXOL 300 MG/ML  SOLN
80.0000 mL | Freq: Once | INTRAMUSCULAR | Status: AC | PRN
  Administered 2013-12-02: 80 mL via INTRAVENOUS

## 2013-12-16 ENCOUNTER — Telehealth: Payer: Self-pay | Admitting: *Deleted

## 2013-12-16 NOTE — Telephone Encounter (Signed)
Per NP, notified daughter, Veronica Pierce, about the CT scan results. Patient denies SOB and CP. No further questions. No pulmonary nodules on CT, or any lesion or concern for metastases. They do see changes in the lung likely consistent with her prior radiation therapy.

## 2013-12-16 NOTE — Telephone Encounter (Signed)
No pulmonary nodules on CT, or any lesion or concern for metastases.  They do see changes in the lung likely consistent with her prior radiation therapy.  Is she symptomatic?    Thanks,  L

## 2013-12-16 NOTE — Telephone Encounter (Signed)
Patient's daughter called, Blenda Bridegroom, and would like CT scan results. Return phone # is [phone]. Message forwarded to provider for review.

## 2014-01-27 ENCOUNTER — Emergency Department (HOSPITAL_COMMUNITY): Payer: Medicare Other

## 2014-01-27 ENCOUNTER — Encounter (HOSPITAL_COMMUNITY): Payer: Self-pay | Admitting: Emergency Medicine

## 2014-01-27 ENCOUNTER — Inpatient Hospital Stay (HOSPITAL_COMMUNITY)
Admission: EM | Admit: 2014-01-27 | Discharge: 2014-01-30 | DRG: 418 | Disposition: A | Discharge status: Home / Self Care | Payer: Medicare Other | Attending: Family Medicine | Admitting: Family Medicine

## 2014-01-27 ENCOUNTER — Inpatient Hospital Stay (HOSPITAL_COMMUNITY): Payer: Medicare Other

## 2014-01-27 DIAGNOSIS — R918 Other nonspecific abnormal finding of lung field: Secondary | ICD-10-CM | POA: Diagnosis present

## 2014-01-27 DIAGNOSIS — Z833 Family history of diabetes mellitus: Secondary | ICD-10-CM

## 2014-01-27 DIAGNOSIS — Z85819 Personal history of malignant neoplasm of unspecified site of lip, oral cavity, and pharynx: Secondary | ICD-10-CM | POA: Diagnosis present

## 2014-01-27 DIAGNOSIS — J90 Pleural effusion, not elsewhere classified: Secondary | ICD-10-CM

## 2014-01-27 DIAGNOSIS — R112 Nausea with vomiting, unspecified: Secondary | ICD-10-CM

## 2014-01-27 DIAGNOSIS — K821 Hydrops of gallbladder: Secondary | ICD-10-CM | POA: Diagnosis present

## 2014-01-27 DIAGNOSIS — Z853 Personal history of malignant neoplasm of breast: Secondary | ICD-10-CM

## 2014-01-27 DIAGNOSIS — Z681 Body mass index (BMI) 19 or less, adult: Secondary | ICD-10-CM

## 2014-01-27 DIAGNOSIS — Z9221 Personal history of antineoplastic chemotherapy: Secondary | ICD-10-CM

## 2014-01-27 DIAGNOSIS — E039 Hypothyroidism, unspecified: Secondary | ICD-10-CM | POA: Diagnosis present

## 2014-01-27 DIAGNOSIS — M810 Age-related osteoporosis without current pathological fracture: Secondary | ICD-10-CM | POA: Diagnosis present

## 2014-01-27 DIAGNOSIS — I10 Essential (primary) hypertension: Secondary | ICD-10-CM | POA: Diagnosis present

## 2014-01-27 DIAGNOSIS — C50919 Malignant neoplasm of unspecified site of unspecified female breast: Secondary | ICD-10-CM

## 2014-01-27 DIAGNOSIS — R1031 Right lower quadrant pain: Secondary | ICD-10-CM | POA: Diagnosis present

## 2014-01-27 DIAGNOSIS — R109 Unspecified abdominal pain: Secondary | ICD-10-CM

## 2014-01-27 DIAGNOSIS — C140 Malignant neoplasm of pharynx, unspecified: Secondary | ICD-10-CM

## 2014-01-27 DIAGNOSIS — Z923 Personal history of irradiation: Secondary | ICD-10-CM

## 2014-01-27 DIAGNOSIS — K810 Acute cholecystitis: Principal | ICD-10-CM | POA: Diagnosis present

## 2014-01-27 DIAGNOSIS — K819 Cholecystitis, unspecified: Secondary | ICD-10-CM | POA: Diagnosis present

## 2014-01-27 DIAGNOSIS — Z9010 Acquired absence of unspecified breast and nipple: Secondary | ICD-10-CM

## 2014-01-27 DIAGNOSIS — N3289 Other specified disorders of bladder: Secondary | ICD-10-CM | POA: Diagnosis present

## 2014-01-27 DIAGNOSIS — R52 Pain, unspecified: Secondary | ICD-10-CM

## 2014-01-27 LAB — CBC WITH DIFFERENTIAL/PLATELET
BASOS PCT: 0 % (ref 0–1)
Basophils Absolute: 0 10*3/uL (ref 0.0–0.1)
Eosinophils Absolute: 0 10*3/uL (ref 0.0–0.7)
Eosinophils Relative: 0 % (ref 0–5)
HEMATOCRIT: 40.6 % (ref 36.0–46.0)
HEMOGLOBIN: 14.1 g/dL (ref 12.0–15.0)
LYMPHS ABS: 0.4 10*3/uL — AB (ref 0.7–4.0)
LYMPHS PCT: 4 % — AB (ref 12–46)
MCH: 32.4 pg (ref 26.0–34.0)
MCHC: 34.7 g/dL (ref 30.0–36.0)
MCV: 93.3 fL (ref 78.0–100.0)
MONO ABS: 0.2 10*3/uL (ref 0.1–1.0)
MONOS PCT: 2 % — AB (ref 3–12)
NEUTROS ABS: 9.5 10*3/uL — AB (ref 1.7–7.7)
NEUTROS PCT: 94 % — AB (ref 43–77)
Platelets: 155 10*3/uL (ref 150–400)
RBC: 4.35 MIL/uL (ref 3.87–5.11)
RDW: 13.1 % (ref 11.5–15.5)
WBC: 10.1 10*3/uL (ref 4.0–10.5)

## 2014-01-27 LAB — TROPONIN I: Troponin I: <0.3 ng/mL (ref <0.30)

## 2014-01-27 LAB — PROTIME-INR
INR: 1.04 (ref 0.00–1.49)
Prothrombin Time: 13.4 seconds (ref 11.6–15.2)

## 2014-01-27 LAB — CBC
HCT: 41.2 % (ref 36.0–46.0)
HEMOGLOBIN: 14.3 g/dL (ref 12.0–15.0)
MCH: 32.5 pg (ref 26.0–34.0)
MCHC: 34.7 g/dL (ref 30.0–36.0)
MCV: 93.6 fL (ref 78.0–100.0)
Platelets: 160 10*3/uL (ref 150–400)
RBC: 4.4 MIL/uL (ref 3.87–5.11)
RDW: 13.3 % (ref 11.5–15.5)
WBC: 13.8 10*3/uL — ABNORMAL HIGH (ref 4.0–10.5)

## 2014-01-27 LAB — COMPREHENSIVE METABOLIC PANEL
ALBUMIN: 4 g/dL (ref 3.5–5.2)
ALT: 14 U/L (ref 0–35)
AST: 22 U/L (ref 0–37)
Alkaline Phosphatase: 66 U/L (ref 39–117)
BUN: 20 mg/dL (ref 6–23)
CALCIUM: 9.9 mg/dL (ref 8.4–10.5)
CO2: 26 mEq/L (ref 19–32)
Chloride: 97 mEq/L (ref 96–112)
Creatinine, Ser: 0.69 mg/dL (ref 0.50–1.10)
GFR calc non Af Amer: 80 mL/min — ABNORMAL LOW (ref >90)
GLUCOSE: 186 mg/dL — AB (ref 70–99)
Potassium: 4.4 mEq/L (ref 3.7–5.3)
SODIUM: 140 meq/L (ref 137–147)
Total Bilirubin: 0.4 mg/dL (ref 0.3–1.2)
Total Protein: 8 g/dL (ref 6.0–8.3)

## 2014-01-27 LAB — URINALYSIS, ROUTINE W REFLEX MICROSCOPIC
Bilirubin Urine: NEGATIVE
GLUCOSE, UA: NEGATIVE mg/dL
Ketones, ur: NEGATIVE mg/dL
Nitrite: NEGATIVE
Protein, ur: 30 mg/dL — AB
SPECIFIC GRAVITY, URINE: 1.015 (ref 1.005–1.030)
UROBILINOGEN UA: 0.2 mg/dL (ref 0.0–1.0)
pH: 7 (ref 5.0–8.0)

## 2014-01-27 LAB — CREATININE, SERUM
CREATININE: 0.65 mg/dL (ref 0.50–1.10)
GFR calc Af Amer: >90 mL/min (ref >90)
GFR, EST NON AFRICAN AMERICAN: 82 mL/min — AB (ref >90)

## 2014-01-27 LAB — LIPASE, BLOOD: LIPASE: 46 U/L (ref 11–59)

## 2014-01-27 LAB — APTT: aPTT: 27 seconds (ref 24–37)

## 2014-01-27 LAB — URINE MICROSCOPIC-ADD ON

## 2014-01-27 MED ORDER — IOHEXOL 350 MG/ML SOLN
80.0000 mL | Freq: Once | INTRAVENOUS | Status: AC | PRN
  Administered 2014-01-27: 80 mL via INTRAVENOUS

## 2014-01-27 MED ORDER — FENTANYL CITRATE 0.05 MG/ML IJ SOLN
50.0000 ug | Freq: Once | INTRAMUSCULAR | Status: AC
  Administered 2014-01-27: 50 ug via INTRAVENOUS
  Filled 2014-01-27: qty 2

## 2014-01-27 MED ORDER — SODIUM CHLORIDE 0.9 % IV SOLN
3.0000 g | Freq: Four times a day (QID) | INTRAVENOUS | Status: DC
  Administered 2014-01-27 – 2014-01-30 (×11): 3 g via INTRAVENOUS
  Filled 2014-01-27 (×15): qty 3

## 2014-01-27 MED ORDER — IOHEXOL 300 MG/ML  SOLN
25.0000 mL | INTRAMUSCULAR | Status: AC
  Administered 2014-01-27 (×2): 25 mL via ORAL

## 2014-01-27 MED ORDER — PANTOPRAZOLE SODIUM 40 MG IV SOLR
40.0000 mg | INTRAVENOUS | Status: DC
Start: 2014-01-27 — End: 2014-01-30
  Administered 2014-01-27 – 2014-01-29 (×2): 40 mg via INTRAVENOUS
  Filled 2014-01-27 (×4): qty 40

## 2014-01-27 MED ORDER — LEVOTHYROXINE SODIUM 100 MCG PO TABS
100.0000 ug | ORAL_TABLET | Freq: Every day | ORAL | Status: DC
  Administered 2014-01-28 – 2014-01-30 (×3): 100 ug via ORAL
  Filled 2014-01-27 (×5): qty 1

## 2014-01-27 MED ORDER — HEPARIN SODIUM (PORCINE) 5000 UNIT/ML IJ SOLN
5000.0000 [IU] | Freq: Three times a day (TID) | INTRAMUSCULAR | Status: DC
  Administered 2014-01-27 – 2014-01-30 (×8): 5000 [IU] via SUBCUTANEOUS
  Filled 2014-01-27 (×14): qty 1

## 2014-01-27 MED ORDER — PILOCARPINE HCL 5 MG PO TABS
5.0000 mg | ORAL_TABLET | Freq: Two times a day (BID) | ORAL | Status: DC
  Administered 2014-01-27 – 2014-01-30 (×6): 5 mg via ORAL
  Filled 2014-01-27 (×8): qty 1

## 2014-01-27 MED ORDER — ONDANSETRON HCL 4 MG/2ML IJ SOLN
4.0000 mg | Freq: Once | INTRAMUSCULAR | Status: AC
  Administered 2014-01-27: 4 mg via INTRAVENOUS
  Filled 2014-01-27: qty 2

## 2014-01-27 MED ORDER — SODIUM CHLORIDE 0.9 % IV SOLN
INTRAVENOUS | Status: DC
  Administered 2014-01-27 – 2014-01-28 (×3): via INTRAVENOUS
  Administered 2014-01-29: 75 mL/h via INTRAVENOUS

## 2014-01-27 MED ORDER — SODIUM CHLORIDE 0.9 % IV BOLUS (SEPSIS)
1000.0000 mL | Freq: Once | INTRAVENOUS | Status: AC
  Administered 2014-01-27: 1000 mL via INTRAVENOUS

## 2014-01-27 MED ORDER — NITROGLYCERIN 0.4 MG SL SUBL
0.4000 mg | SUBLINGUAL_TABLET | SUBLINGUAL | Status: DC | PRN
  Administered 2014-01-27 (×2): 0.4 mg via SUBLINGUAL

## 2014-01-27 MED ORDER — MORPHINE SULFATE 2 MG/ML IJ SOLN
1.0000 mg | INTRAMUSCULAR | Status: DC | PRN
  Administered 2014-01-27: 1 mg via INTRAVENOUS
  Administered 2014-01-28: 2 mg via INTRAVENOUS
  Administered 2014-01-28 – 2014-01-29 (×2): 1 mg via INTRAVENOUS
  Filled 2014-01-27 (×4): qty 1

## 2014-01-27 MED ORDER — LETROZOLE 2.5 MG PO TABS
2.5000 mg | ORAL_TABLET | Freq: Every day | ORAL | Status: DC
  Administered 2014-01-28 – 2014-01-30 (×3): 2.5 mg via ORAL
  Filled 2014-01-27 (×4): qty 1

## 2014-01-27 MED ORDER — ONDANSETRON HCL 4 MG PO TABS: 4.0000 mg | ORAL_TABLET | Freq: Four times a day (QID) | ORAL | Status: DC | PRN

## 2014-01-27 MED ORDER — ONDANSETRON HCL 4 MG/2ML IJ SOLN
4.0000 mg | Freq: Four times a day (QID) | INTRAMUSCULAR | Status: DC | PRN
  Administered 2014-01-27: 4 mg via INTRAVENOUS
  Filled 2014-01-27: qty 2

## 2014-01-27 MED ORDER — SODIUM CHLORIDE 0.9 % IJ SOLN
3.0000 mL | Freq: Two times a day (BID) | INTRAMUSCULAR | Status: DC
  Administered 2014-01-27: 3 mL via INTRAVENOUS

## 2014-01-27 NOTE — ED Notes (Signed)
Family at bedside. 

## 2014-01-27 NOTE — ED Notes (Signed)
Pt brought back to room, undressed, in gown and EKG performed; family at bedside

## 2014-01-27 NOTE — ED Provider Notes (Signed)
CSN: [id number]     Arrival date & time 01/27/14  0800 History   First MD Initiated Contact with Patient 01/27/14 [phone]     Chief Complaint  Patient presents with  . Chest Pain  . Abdominal Pain   (Consider location/radiation/quality/duration/timing/severity/associated sxs/prior Treatment) Patient is a 80 y.o. female presenting with chest pain and abdominal pain.  Chest Pain Associated symptoms: abdominal pain   Abdominal Pain Associated symptoms: chest pain    Pt with history of throat cancer (treated with Chemo) and breast cancer (bilateral mastectomy) also found to have two pulmonary nodules treated with radiation. She has been doing well, CT chest in Dec 2014 did not show evidence of recurrence. Last night around 9pm began to have chest tightness, not described as pain, moderate in severity, worse with deep breath. Has also had abdominal tightness since early this morning with several episode of vomiting this AM before arrival to the ED. Has had some loose stools recently, no dysuria or hematuria.   Past Medical History  Diagnosis Date  . Throat cancer 2010  . Breast cancer 2010    left   . Lung cancer 2012  . Breast cancer 2013    right  . Hypothyroidism   . HTN (hypertension)    Past Surgical History  Procedure Laterality Date  . Mastectomy Bilateral     2010, 2013   Family History  Problem Relation Age of Onset  . Diabetes Mother   . Arthritis Father    History  Substance Use Topics  . Smoking status: Never Smoker   . Smokeless tobacco: Never Used  . Alcohol Use: No   OB History   Grav Para Term Preterm Abortions TAB SAB Ect Mult Living                 Review of Systems  Cardiovascular: Positive for chest pain.  Gastrointestinal: Positive for abdominal pain.   All other systems reviewed and are negative except as noted in HPI.   Allergies  Review of patient's allergies indicates no known allergies.  Home Medications   Current Outpatient Rx  Name   Route  Sig  Dispense  Refill  . alendronate (FOSAMAX) 35 MG tablet   Oral   Take 1 tablet (35 mg total) by mouth every 7 (seven) days. Take with a full glass of water on an empty stomach.   12 tablet   6   . calcium gluconate 500 MG tablet   Oral   Take 1,000 mg by mouth daily.         Marland Kitchen guaifenesin (MUCUS RELIEF) 400 MG TABS   Oral   Take 400 mg by mouth as needed.         Marland Kitchen letrozole (FEMARA) 2.5 MG tablet   Oral   Take 1 tablet (2.5 mg total) by mouth daily.   90 tablet   4   . levothyroxine (SYNTHROID, LEVOTHROID) 100 MCG tablet      TAKE 1 TABLET EVERY DAY   30 tablet   11   . oxyCODONE (ROXICODONE) 5 MG/5ML solution   Oral   Take 5 mLs (5 mg total) by mouth every 4 (four) hours as needed.   100 mL   0   . pilocarpine (SALAGEN) 5 MG tablet   Oral   Take 5 mg by mouth every 12 (twelve) hours.          BP 179/63  Pulse 88  Temp(Src) 97.9 F (36.6 C) (Oral)  Resp  20  Ht 5\' 5"  (1.651 m)  Wt 102 lb (46.267 kg)  BMI 16.97 kg/m2  SpO2 100% Physical Exam  Nursing note and vitals reviewed. Constitutional: She is oriented to person, place, and time. She appears well-developed and well-nourished.  HENT:  Head: Normocephalic and atraumatic.  Eyes: EOM are normal. Pupils are equal, round, and reactive to light.  Neck: Normal range of motion. Neck supple.  Cardiovascular: Normal rate, normal heart sounds and intact distal pulses.   Pulmonary/Chest: Effort normal and breath sounds normal. She has no wheezes. She has no rales.  Abdominal: Bowel sounds are normal. She exhibits mass (5-6cm hard round, nontender mass in RLQ). She exhibits no distension. There is no tenderness. There is no rebound.  Musculoskeletal: Normal range of motion. She exhibits no edema and no tenderness.  Neurological: She is alert and oriented to person, place, and time. She has normal strength. No cranial nerve deficit or sensory deficit.  Skin: Skin is warm and dry. No rash noted.   Psychiatric: She has a normal mood and affect.    ED Course  Procedures (including critical care time) Labs Review Labs Reviewed  CBC WITH DIFFERENTIAL - Abnormal; Notable for the following:    Neutrophils Relative % 94 (*)    Neutro Abs 9.5 (*)    Lymphocytes Relative 4 (*)    Lymphs Abs 0.4 (*)    Monocytes Relative 2 (*)    All other components within normal limits  URINALYSIS, ROUTINE W REFLEX MICROSCOPIC - Abnormal; Notable for the following:    Hgb urine dipstick MODERATE (*)    Protein, ur 30 (*)    Leukocytes, UA TRACE (*)    All other components within normal limits  COMPREHENSIVE METABOLIC PANEL - Abnormal; Notable for the following:    Glucose, Bld 186 (*)    GFR calc non Af Amer 80 (*)    All other components within normal limits  URINE MICROSCOPIC-ADD ON - Abnormal; Notable for the following:    Bacteria, UA FEW (*)    All other components within normal limits  URINE CULTURE  TROPONIN I  PROTIME-INR  APTT  LIPASE, BLOOD   Imaging Review Ct Angio Chest Pe W/cm &/or Wo Cm  01/27/2014   CLINICAL DATA:  Chest pain with history of throat and breast malignancy  EXAM: CT ANGIOGRAPHY CHEST WITH CONTRAST  TECHNIQUE: Multidetector CT imaging of the chest was performed using the standard protocol during bolus administration of intravenous contrast. Multiplanar CT image reconstructions including MIPs were obtained to evaluate the vascular anatomy.  CONTRAST:  66mL OMNIPAQUE IOHEXOL 350 MG/ML SOLN  COMPARISON:  CT CHEST W/CM dated 12/02/2013  FINDINGS: Contrast within the pulmonary arterial tree is normal in appearance. There are no filling defects to suggest an acute pulmonary embolism. The caliber of the thoracic aorta is normal. There is no evidence of a false lumen. There is a moderate-sized right pleural effusion which has increased slightly in volume since the previous study. There is no left pleural effusion. There is no pericardial effusion. The cardiac chambers are  top-normal in size. No bulky mediastinal lymph nodes are present. There is soft tissue fullness in the right suprahilar region which is stable.  Within the upper abdomen the observed portions of the liver and spleen appear normal. There are no adrenal masses.  At lung window settings there is extensive pleural and parenchymal abnormality in the right upper lobe. This has not greatly changed since the previous study. There is also pleural-based  density anteriorly and laterally in the right middle lobe. The left upper lobe demonstrates stable pleural-based increased density superiorly. Elsewhere within the left lung no significant parenchymal abnormality is demonstrated.  Within the upper abdomen the observed portions of the liver and spleen appear normal. The known L1 compression is only partially included in the field of view. No thoracic vertebral body fracture is demonstrated. The sternum appears intact. No acute rib lesion is demonstrated.  Review of the MIP images confirms the above findings.  IMPRESSION: 1. There is no evidence of an acute pulmonary embolism nor acute thoracic aortic pathology. 2. The moderate-sized right pleural effusion has increased in size since the previous study. 3. There is a stable appearance of the pleural and parenchymal scarring bilaterally but most conspicuously in the right upper lobe. Stable bronchiectatic changes in the right upper lobe are demonstrated. 4. No bulky mediastinal or hilar lymph adenopathy is demonstrated.   Electronically Signed   By: David  Martinique   On: 01/27/2014 11:06   Ct Abdomen Pelvis W Contrast  01/27/2014   CLINICAL DATA:  History of throat and breast malignancy, chest pain and dyspnea  EXAM: CT ABDOMEN AND PELVIS WITH CONTRAST  TECHNIQUE: Multidetector CT imaging of the abdomen and pelvis was performed using the standard protocol following bolus administration of intravenous contrast.  CONTRAST:  42mL OMNIPAQUE IOHEXOL 350 MG/ML SOLN intravenously. The  patient also received oral contrast material.  COMPARISON:  CT scan of the chest dated December 02, 2013.  FINDINGS: The liver exhibits no focal mass nor ductal dilation. The gallbladder is markedly distended today. It was not fully included in the field of view on the previous study and thus direct comparison is not possible. There is mild gallbladder wall thickening. No pericholecystic fluid is evident. The pancreas, spleen, and adrenal glands are normal in appearance. The enhancement blush of the renal cortex is prominent bilaterally likely due to the early phase of the arterial injection for the accompanying PE study. There is a midpole hypodensity measuring approximately 7 x 10 mm in the right midpole most compatible with a cyst. There are extrarenal pelves bilaterally.  The caliber of the abdominal aorta is normal. The partially contrast-filled loops of small and large bowel exhibit no evidence of ileus nor of obstruction. No evidence of colitis or diverticulitis is demonstrated. A structure which likely reflects an air-filled normal calibered appendix is demonstrated in the right aspect of the pelvis. Urinary bladder is distended. The uterus is small appropriate for age. There is a cystic appearing left adnexal mass measuring approximately 4 cm in diameter and exhibiting a Hounsfield measurement of -29. There is no evidence of ascites. There is no inguinal or umbilical hernia.  The body of L1 exhibits moderate compression with loss of height anteriorly of 30%. There is mild retropulsion of the posterior superior cortex. There is diffusely increased density within the L1 vertebral body. The other vertebral bodies are preserved in height and demonstrate normal density. The bony pelvis exhibits no suspicious lytic or blastic lesions.  IMPRESSION: 1. There is abnormal distention of the gallbladder with wall thickening. This may reflect acute cholecystitis. No stones are evident. Right upper quadrant ultrasound  would be useful. 2. No acute abnormality of the liver or pancreas or spleen is demonstrated. 3. There is no evidence of acute bowel abnormality nor acute urinary tract abnormality. The urinary bladder is moderately distended and Foley catheterization may be useful if the patient cannot spontaneously void. 4. There is no evidence of  ascites. No bulky intra-abdominal or pelvic lymphadenopathy is demonstrated. 5. There is stable wedge compression of the body of L1 with mild retropulsion of bone.   Electronically Signed   By: David  Martinique   On: 01/27/2014 11:18    EKG Interpretation    Date/Time:  Tuesday January 27 2014 08:04:54 EST Ventricular Rate:  90 PR Interval:  178 QRS Duration: 88 QT Interval:  360 QTC Calculation: 440 R Axis:   65 Text Interpretation:  Sinus rhythm Atrial premature complexes Consider right atrial enlargement Minimal ST depression, inferior leads No old tracing to compare Confirmed by [facility]  MD, CHARLES ([id number]) on 01/27/2014 8:41:23 AM            MDM   1. Cholecystitis   2. Pleural effusion     Labs and imaging reviewed. No signs of return of cancer, but markedly enlarged gall bladder on CT with signs of cholecystitis. Reviewed the images with general surgery PA. Will get a formal consult and admit to Medicine for further eval.     Charles B. Karle Starch, MD 01/27/14 1221

## 2014-01-27 NOTE — ED Notes (Signed)
Pt finished drinking Oral CT contrast, notified CT.

## 2014-01-27 NOTE — ED Notes (Signed)
Pt returned from radiology.

## 2014-01-27 NOTE — Consult Note (Signed)
Veronica Pierce 06-12-1931  [id number].   Primary Care MD: Dr. Junie Panning (sp?) with [facility] at [facility] Requesting MD: Dr. Calvert Cantor Chief Complaint/Reason for Consult: acute cholecystitis HPI: This is an 80 yo white female who has a h/o throat cancer s/p radiation/chemo, and bilateral breast cancer s/p bilateral mastectomies.  She has also had lung nodules that required radiation as well.  They were unsure what the lung nodules were from, but treated them with radiation.  This was at least 5 years ago in [facility]  They just moved back here 8 months ago.  Dr. Humphrey Rolls follows her now for all of this.  Last night she woke up with abdominal pain about an hour after going to sleep.  She had beef stew for supper.  She's never had abdominal pain like this before.  She developed nausea and vomiting last night after she developed her pain.  She had 3 BMs as well.  Due to continued pain, she came to the [facility] for evaluation.  She had normal labs, but a CT scan that reveals probable acute cholecystitis with abnormal distention and gallbladder wall thickening.  No stones are seen.  We have been asked to see for further evaluation.  ROS: Please see HPI, otherwise she did c/o some chest tightness, but all other systems are negative and have been reviewed.  Family History  Problem Relation Age of Onset  . Diabetes Mother   . Arthritis Father     Past Medical History  Diagnosis Date  . Throat cancer 2010  . Breast cancer 2010    left   . Lung cancer 2012  . Breast cancer 2013    right  . Hypothyroidism   . HTN (hypertension)     Past Surgical History  Procedure Laterality Date  . Mastectomy Bilateral     2010, 2013  . Mastectomy  bilateral  . Back surgery    . Portacath placement    . Port-a-cath removal      Social History:  reports that she has never smoked. She has never used smokeless tobacco. She reports that she does not drink alcohol or use illicit drugs.  Allergies: No Known  Allergies   (Not in a hospital admission)  Blood pressure 138/60, pulse 80, temperature 97.9 F (36.6 C), temperature source Oral, resp. rate 14, height 5' 5"  (1.651 m), weight 102 lb (46.267 kg), SpO2 97.00%. Physical Exam: General: pleasant, skinny white female who is laying in bed in NAD HEENT: head is normocephalic, atraumatic.  Sclera are noninjected.  PERRL.  Ears and nose without any masses or lesions.  Mouth is pink and moist Heart: regular, rate, and rhythm.  Normal s1,s2. No obvious murmurs, gallops, or rubs noted.  Palpable radial and pedal pulses bilaterally Lungs: CTAB, no wheezes, rhonchi, or rales noted.  Respiratory effort nonlabored, bilateral mastectomies Abd: soft, large gallbladder palpable and tender in RUQ extending down into her right pelvis, ND, +BS, no masses, hernias, or organomegaly MS: all 4 extremities are symmetrical with no cyanosis, clubbing, or edema. Skin: warm and dry with no masses, lesions, or rashes Psych: A&Ox3 with an appropriate affect.    Results for orders placed during the hospital encounter of 01/27/14 (from the past 48 hour(s))  CBC WITH DIFFERENTIAL     Status: Abnormal   Collection Time    01/27/14  8:40 AM      Result Value Range   WBC 10.1  4.0 - 10.5 K/uL   RBC 4.35  3.87 -  5.11 MIL/uL   Hemoglobin 14.1  12.0 - 15.0 g/dL   HCT 40.6  36.0 - 46.0 %   MCV 93.3  78.0 - 100.0 fL   MCH 32.4  26.0 - 34.0 pg   MCHC 34.7  30.0 - 36.0 g/dL   RDW 13.1  11.5 - 15.5 %   Platelets 155  150 - 400 K/uL   Neutrophils Relative % 94 (*) 43 - 77 %   Neutro Abs 9.5 (*) 1.7 - 7.7 K/uL   Lymphocytes Relative 4 (*) 12 - 46 %   Lymphs Abs 0.4 (*) 0.7 - 4.0 K/uL   Monocytes Relative 2 (*) 3 - 12 %   Monocytes Absolute 0.2  0.1 - 1.0 K/uL   Eosinophils Relative 0  0 - 5 %   Eosinophils Absolute 0.0  0.0 - 0.7 K/uL   Basophils Relative 0  0 - 1 %   Basophils Absolute 0.0  0.0 - 0.1 K/uL  TROPONIN I     Status: None   Collection Time    01/27/14   8:40 AM      Result Value Range   Troponin I <0.30  <0.30 ng/mL   Comment:            Due to the release kinetics of cTnI,     a negative result within the first hours     of the onset of symptoms does not rule out     myocardial infarction with certainty.     If myocardial infarction is still suspected,     repeat the test at appropriate intervals.  PROTIME-INR     Status: None   Collection Time    01/27/14  8:40 AM      Result Value Range   Prothrombin Time 13.4  11.6 - 15.2 seconds   INR 1.04  0.00 - 1.49  APTT     Status: None   Collection Time    01/27/14  8:40 AM      Result Value Range   aPTT 27  24 - 37 seconds  COMPREHENSIVE METABOLIC PANEL     Status: Abnormal   Collection Time    01/27/14  8:40 AM      Result Value Range   Sodium 140  137 - 147 mEq/L   Potassium 4.4  3.7 - 5.3 mEq/L   Chloride 97  96 - 112 mEq/L   CO2 26  19 - 32 mEq/L   Glucose, Bld 186 (*) 70 - 99 mg/dL   BUN 20  6 - 23 mg/dL   Creatinine, Ser 0.69  0.50 - 1.10 mg/dL   Calcium 9.9  8.4 - 10.5 mg/dL   Total Protein 8.0  6.0 - 8.3 g/dL   Albumin 4.0  3.5 - 5.2 g/dL   AST 22  0 - 37 U/L   ALT 14  0 - 35 U/L   Alkaline Phosphatase 66  39 - 117 U/L   Total Bilirubin 0.4  0.3 - 1.2 mg/dL   GFR calc non Af Amer 80 (*) >90 mL/min   GFR calc Af Amer >90  >90 mL/min   Comment: (NOTE)     The eGFR has been calculated using the CKD EPI equation.     This calculation has not been validated in all clinical situations.     eGFR's persistently <90 mL/min signify possible Chronic Kidney     Disease.  LIPASE, BLOOD     Status: None   Collection Time  01/27/14  8:40 AM      Result Value Range   Lipase 46  11 - 59 U/L  URINALYSIS, ROUTINE W REFLEX MICROSCOPIC     Status: Abnormal   Collection Time    01/27/14 10:20 AM      Result Value Range   Color, Urine YELLOW  YELLOW   APPearance CLEAR  CLEAR   Specific Gravity, Urine 1.015  1.005 - 1.030   pH 7.0  5.0 - 8.0   Glucose, UA NEGATIVE  NEGATIVE  mg/dL   Hgb urine dipstick MODERATE (*) NEGATIVE   Bilirubin Urine NEGATIVE  NEGATIVE   Ketones, ur NEGATIVE  NEGATIVE mg/dL   Protein, ur 30 (*) NEGATIVE mg/dL   Urobilinogen, UA 0.2  0.0 - 1.0 mg/dL   Nitrite NEGATIVE  NEGATIVE   Leukocytes, UA TRACE (*) NEGATIVE  URINE MICROSCOPIC-ADD ON     Status: Abnormal   Collection Time    01/27/14 10:20 AM      Result Value Range   Squamous Epithelial / LPF RARE  RARE   WBC, UA 3-6  <3 WBC/hpf   RBC / HPF 7-10  <3 RBC/hpf   Bacteria, UA FEW (*) RARE   Ct Angio Chest Pe W/cm &/or Wo Cm  01/27/2014   CLINICAL DATA:  Chest pain with history of throat and breast malignancy  EXAM: CT ANGIOGRAPHY CHEST WITH CONTRAST  TECHNIQUE: Multidetector CT imaging of the chest was performed using the standard protocol during bolus administration of intravenous contrast. Multiplanar CT image reconstructions including MIPs were obtained to evaluate the vascular anatomy.  CONTRAST:  83m OMNIPAQUE IOHEXOL 350 MG/ML SOLN  COMPARISON:  CT CHEST W/CM dated 12/02/2013  FINDINGS: Contrast within the pulmonary arterial tree is normal in appearance. There are no filling defects to suggest an acute pulmonary embolism. The caliber of the thoracic aorta is normal. There is no evidence of a false lumen. There is a moderate-sized right pleural effusion which has increased slightly in volume since the previous study. There is no left pleural effusion. There is no pericardial effusion. The cardiac chambers are top-normal in size. No bulky mediastinal lymph nodes are present. There is soft tissue fullness in the right suprahilar region which is stable.  Within the upper abdomen the observed portions of the liver and spleen appear normal. There are no adrenal masses.  At lung window settings there is extensive pleural and parenchymal abnormality in the right upper lobe. This has not greatly changed since the previous study. There is also pleural-based density anteriorly and laterally in the  right middle lobe. The left upper lobe demonstrates stable pleural-based increased density superiorly. Elsewhere within the left lung no significant parenchymal abnormality is demonstrated.  Within the upper abdomen the observed portions of the liver and spleen appear normal. The known L1 compression is only partially included in the field of view. No thoracic vertebral body fracture is demonstrated. The sternum appears intact. No acute rib lesion is demonstrated.  Review of the MIP images confirms the above findings.  IMPRESSION: 1. There is no evidence of an acute pulmonary embolism nor acute thoracic aortic pathology. 2. The moderate-sized right pleural effusion has increased in size since the previous study. 3. There is a stable appearance of the pleural and parenchymal scarring bilaterally but most conspicuously in the right upper lobe. Stable bronchiectatic changes in the right upper lobe are demonstrated. 4. No bulky mediastinal or hilar lymph adenopathy is demonstrated.   Electronically Signed   By: DShanon Brow  [place]   On: 01/27/2014 11:06   Ct Abdomen Pelvis W Contrast  01/27/2014   CLINICAL DATA:  History of throat and breast malignancy, chest pain and dyspnea  EXAM: CT ABDOMEN AND PELVIS WITH CONTRAST  TECHNIQUE: Multidetector CT imaging of the abdomen and pelvis was performed using the standard protocol following bolus administration of intravenous contrast.  CONTRAST:  49m OMNIPAQUE IOHEXOL 350 MG/ML SOLN intravenously. The patient also received oral contrast material.  COMPARISON:  CT scan of the chest dated December 02, 2013.  FINDINGS: The liver exhibits no focal mass nor ductal dilation. The gallbladder is markedly distended today. It was not fully included in the field of view on the previous study and thus direct comparison is not possible. There is mild gallbladder wall thickening. No pericholecystic fluid is evident. The pancreas, spleen, and adrenal glands are normal in appearance. The  enhancement blush of the renal cortex is prominent bilaterally likely due to the early phase of the arterial injection for the accompanying PE study. There is a midpole hypodensity measuring approximately 7 x 10 mm in the right midpole most compatible with a cyst. There are extrarenal pelves bilaterally.  The caliber of the abdominal aorta is normal. The partially contrast-filled loops of small and large bowel exhibit no evidence of ileus nor of obstruction. No evidence of colitis or diverticulitis is demonstrated. A structure which likely reflects an air-filled normal calibered appendix is demonstrated in the right aspect of the pelvis. Urinary bladder is distended. The uterus is small appropriate for age. There is a cystic appearing left adnexal mass measuring approximately 4 cm in diameter and exhibiting a Hounsfield measurement of -29. There is no evidence of ascites. There is no inguinal or umbilical hernia.  The body of L1 exhibits moderate compression with loss of height anteriorly of 30%. There is mild retropulsion of the posterior superior cortex. There is diffusely increased density within the L1 vertebral body. The other vertebral bodies are preserved in height and demonstrate normal density. The bony pelvis exhibits no suspicious lytic or blastic lesions.  IMPRESSION: 1. There is abnormal distention of the gallbladder with wall thickening. This may reflect acute cholecystitis. No stones are evident. Right upper quadrant ultrasound would be useful. 2. No acute abnormality of the liver or pancreas or spleen is demonstrated. 3. There is no evidence of acute bowel abnormality nor acute urinary tract abnormality. The urinary bladder is moderately distended and Foley catheterization may be useful if the patient cannot spontaneously void. 4. There is no evidence of ascites. No bulky intra-abdominal or pelvic lymphadenopathy is demonstrated. 5. There is stable wedge compression of the body of L1 with mild  retropulsion of bone.   Electronically Signed   By: David  JMartinique  On: 01/27/2014 11:18       Assessment/Plan 1. Probable cholecystitis 2. H/o throat cancer 3. H/o bilateral breast cancer 4. H/o lung nodules, s/p radiation 5. Right pleural effusion, right parenchymal abnormality  Plan: 1. We would ask the medicine team to admit the patient for medical clearance and evaluation of her lung.  We would like to get an abdominal ultrasound to better look at the gallbladder, but suspect she may need some type of intervention for her gallbladder this admission. 2. Would recommend abx therapy such as unasyn or cipro for her gallbladder.  We will follow along.  Ellene Bloodsaw E 01/27/2014, 12:59 PM Pager: [phone]

## 2014-01-27 NOTE — Progress Notes (Signed)
ANTIBIOTIC CONSULT NOTE - INITIAL  Pharmacy Consult for unasyn Indication: acute acalculus cholecystitis  No Known Allergies  Patient Measurements: Height: 5\' 5"  (165.1 cm) Weight: 102 lb (46.267 kg) IBW/kg (Calculated) : 57 Adjusted Body Weight:   Vital Signs: Temp: 99.6 F (37.6 C) (02/03 1329) Temp src: Oral (02/03 1329) BP: 168/77 mmHg (02/03 1329) Pulse Rate: 84 (02/03 1329) Intake/Output from previous day:   Intake/Output from this shift:    Labs:  Recent Labs  01/27/14 0840  WBC 10.1  HGB 14.1  PLT 155  CREATININE 0.69   Estimated Creatinine Clearance: 41 ml/min (by C-G formula based on Cr of 0.69). No results found for this basename: VANCOTROUGH, VANCOPEAK, VANCORANDOM, GENTTROUGH, GENTPEAK, GENTRANDOM, TOBRATROUGH, TOBRAPEAK, TOBRARND, AMIKACINPEAK, AMIKACINTROU, AMIKACIN,  in the last 72 hours   Microbiology: No results found for this or any previous visit (from the past 720 hour(s)).  Medical History: Past Medical History  Diagnosis Date  . Throat cancer 2010  . Breast cancer 2010    left   . Lung cancer 2012  . Breast cancer 2013    right  . Hypothyroidism   . HTN (hypertension)    Assessment: Patient is an 80 y.o F with acute cholecystitis.  To start unsayn for empiric coverage. Scr 0.69 (crcl~32)  Plan:  1) unasyn 3gm IV q6h 2) pharmacy will sign off  Nazanin Kinner P 01/27/2014,2:52 PM

## 2014-01-27 NOTE — H&P (Signed)
History and Physical Examination  Veronica Pierce XIP:[id number] DOB: 30-Aug-1931 DOA: 01/27/2014  Referring physician: Dr. Karle Starch PCP: Arnette Norris, MD   Chief Complaint: chest pain  HPI: Veronica Pierce is a 80 y.o. female with hypertension (not medically treated), hypothyroidism, h/o throat cancer s/p radiation/chemo, and bilateral breast cancer s/p bilateral mastectomies who presented to ER early this morning with a complaint of severe abdominal pain 8-10/10 that started 2 hours after going to bed.  She had beef stew, honey dew and coconut pie for supper.  She had been well up until that time.  The abdominal pain woke her up from sleeping and she could not return to sleep because of the pain.  She developed intense nausea and vomited 4 times.  She's never had abdominal pain like this before. She has also had lung nodules that required radiation as well. They were unsure what the lung nodules were from, but treated them with radiation.   She developed nausea and vomiting last night after she developed her pain. She also had 3 BMs as well.  The severe abdominal pain persisted and she came to the ED for evaluation.  A CT scan that revealed probable acute cholecystitis with abnormal distention and gallbladder wall thickening. No stones are seen. The general surgery team was consulted and recommended that patient have a medical admission. The patient had a CT of the lungs that revealed a mild pleural effusion.  The patient complains of vague chest discomfort on the right side and mild SOB on occasion.  She has had radiation to her lungs for treatment of lung nodules.   Past Medical History Past Medical History  Diagnosis Date  . Throat cancer 2010  . Breast cancer 2010    left   . Lung cancer 2012  . Breast cancer 2013    right  . Hypothyroidism   . HTN (hypertension) - Not treated with medications  Osteoporosis    Past Surgical History Past Surgical History  Procedure Laterality Date  . Mastectomy  Bilateral     2010, 2013  . Mastectomy  bilateral  . Back surgery    . Portacath placement    . Port-a-cath removal      Home Meds: Prior to Admission medications   Medication Sig Start Date End Date Taking? Authorizing Provider  alendronate (FOSAMAX) 35 MG tablet Take 1 tablet (35 mg total) by mouth every 7 (seven) days. Take with a full glass of water on an empty stomach. 08/22/13  Yes Deatra Robinson, MD  calcium gluconate 500 MG tablet Take 1,000 mg by mouth daily.   Yes Historical Provider, MD  guaifenesin (MUCUS RELIEF) 400 MG TABS Take 400 mg by mouth daily as needed (congestion).    Yes Historical Provider, MD  letrozole (FEMARA) 2.5 MG tablet Take 1 tablet (2.5 mg total) by mouth daily. 10/16/13  Yes Deatra Robinson, MD  levothyroxine (SYNTHROID, LEVOTHROID) 100 MCG tablet Take 100 mcg by mouth daily before breakfast.   Yes Historical Provider, MD  oxyCODONE (ROXICODONE) 5 MG/5ML solution Take 5 mg by mouth every 4 (four) hours as needed for severe pain.   Yes Historical Provider, MD  pilocarpine (SALAGEN) 5 MG tablet Take 5 mg by mouth every 12 (twelve) hours.   Yes Historical Provider, MD   Allergies: Review of patient's allergies indicates no known allergies.  Social History:  History   Social History  . Marital Status: Married    Spouse Name: N/A    Number of Children:  N/A  . Years of Education: N/A   Occupational History  . Not on file.   Social History Main Topics  . Smoking status: Never Smoker   . Smokeless tobacco: Never Used  . Alcohol Use: No  . Drug Use: No  . Sexual Activity: Not Currently   Other Topics Concern  . Not on file   Social History Narrative   Recently moved with here with husband to [facility].   Daughter lives here and is my patient.   Family History:  Family History  Problem Relation Age of Onset  . Diabetes Mother   . Arthritis Father    Review of Systems:  Review of Systems  Constitutional: Negative for fever, chills, weight  loss, malaise/fatigue and diaphoresis.  HENT: Negative for congestion, ear discharge, ear pain, hearing loss, nosebleeds, sore throat and tinnitus.   Eyes: Negative for blurred vision, double vision, photophobia, pain, discharge and redness.  Respiratory: Negative for cough, hemoptysis, sputum production, shortness of breath, wheezing and stridor.   Cardiovascular: Negative for chest pain, palpitations, orthopnea, claudication, leg swelling and PND.  Gastrointestinal: Positive for nausea, vomiting and abdominal pain. Negative for heartburn, diarrhea, constipation, blood in stool and melena.  Genitourinary: Negative for dysuria, urgency, frequency, hematuria and flank pain.  Musculoskeletal: Negative for back pain, falls, joint pain, myalgias and neck pain.  Skin: Negative for itching and rash.  Neurological: Positive for weakness. Negative for dizziness, tingling, tremors, sensory change, speech change, focal weakness, seizures and headaches.  Endo/Heme/Allergies: Negative for environmental allergies and polydipsia. Does not bruise/bleed easily.  Psychiatric/Behavioral: Negative for depression, suicidal ideas, hallucinations, memory loss and substance abuse. The patient is not nervous/anxious and does not have insomnia.   All other systems reviewed and are negative.  Physical Exam: Blood pressure 168/77, pulse 84, temperature 99.6 F (37.6 C), temperature source Oral, resp. rate 20, height 5\' 5"  (1.651 m), weight 102 lb (46.267 kg), SpO2 95.00%. Constitutional: She is oriented to person, place, and time. She appears well-developed and well-nourished. No apparent distress.  HENT: Head: Normocephalic and atraumatic.  Eyes: EOM are normal. Pupils are equal, round, and reactive to light.  Neck: Normal range of motion. Neck supple.  Cardiovascular: Normal rate, normal heart sounds and intact distal pulses.  Pulmonary/Chest: Effort normal and breath sounds normal. She has no wheezes. She has no  rales.  Abdominal: soft, fairly flat, Bowel sounds are normal. She exhibits mass (5 cm hard round, nontender mass in RLQ protrudes into abd wall). She exhibits no distension. There is no tenderness. There is no rebound.  Musculoskeletal: Normal range of motion. She exhibits no edema and no tenderness.  Neurological: She is alert and oriented to person, place, and time. She has normal strength. No cranial nerve deficit or sensory deficit.  Skin: Skin is warm and dry. No rash noted.  Psychiatric: She has a normal mood and affect.   Lab  And Imaging results:  Results for orders placed during the hospital encounter of 01/27/14 (from the past 24 hour(s))  CBC WITH DIFFERENTIAL     Status: Abnormal   Collection Time    01/27/14  8:40 AM      Result Value Range   WBC 10.1  4.0 - 10.5 K/uL   RBC 4.35  3.87 - 5.11 MIL/uL   Hemoglobin 14.1  12.0 - 15.0 g/dL   HCT 40.6  36.0 - 46.0 %   MCV 93.3  78.0 - 100.0 fL   MCH 32.4  26.0 - 34.0  pg   MCHC 34.7  30.0 - 36.0 g/dL   RDW 13.1  11.5 - 15.5 %   Platelets 155  150 - 400 K/uL   Neutrophils Relative % 94 (*) 43 - 77 %   Neutro Abs 9.5 (*) 1.7 - 7.7 K/uL   Lymphocytes Relative 4 (*) 12 - 46 %   Lymphs Abs 0.4 (*) 0.7 - 4.0 K/uL   Monocytes Relative 2 (*) 3 - 12 %   Monocytes Absolute 0.2  0.1 - 1.0 K/uL   Eosinophils Relative 0  0 - 5 %   Eosinophils Absolute 0.0  0.0 - 0.7 K/uL   Basophils Relative 0  0 - 1 %   Basophils Absolute 0.0  0.0 - 0.1 K/uL  TROPONIN I     Status: None   Collection Time    01/27/14  8:40 AM      Result Value Range   Troponin I <0.30  <0.30 ng/mL  PROTIME-INR     Status: None   Collection Time    01/27/14  8:40 AM      Result Value Range   Prothrombin Time 13.4  11.6 - 15.2 seconds   INR 1.04  0.00 - 1.49  APTT     Status: None   Collection Time    01/27/14  8:40 AM      Result Value Range   aPTT 27  24 - 37 seconds  COMPREHENSIVE METABOLIC PANEL     Status: Abnormal   Collection Time    01/27/14  8:40 AM        Result Value Range   Sodium 140  137 - 147 mEq/L   Potassium 4.4  3.7 - 5.3 mEq/L   Chloride 97  96 - 112 mEq/L   CO2 26  19 - 32 mEq/L   Glucose, Bld 186 (*) 70 - 99 mg/dL   BUN 20  6 - 23 mg/dL   Creatinine, Ser 0.69  0.50 - 1.10 mg/dL   Calcium 9.9  8.4 - 10.5 mg/dL   Total Protein 8.0  6.0 - 8.3 g/dL   Albumin 4.0  3.5 - 5.2 g/dL   AST 22  0 - 37 U/L   ALT 14  0 - 35 U/L   Alkaline Phosphatase 66  39 - 117 U/L   Total Bilirubin 0.4  0.3 - 1.2 mg/dL   GFR calc non Af Amer 80 (*) >90 mL/min   GFR calc Af Amer >90  >90 mL/min  LIPASE, BLOOD     Status: None   Collection Time    01/27/14  8:40 AM      Result Value Range   Lipase 46  11 - 59 U/L  URINALYSIS, ROUTINE W REFLEX MICROSCOPIC     Status: Abnormal   Collection Time    01/27/14 10:20 AM      Result Value Range   Color, Urine YELLOW  YELLOW   APPearance CLEAR  CLEAR   Specific Gravity, Urine 1.015  1.005 - 1.030   pH 7.0  5.0 - 8.0   Glucose, UA NEGATIVE  NEGATIVE mg/dL   Hgb urine dipstick MODERATE (*) NEGATIVE   Bilirubin Urine NEGATIVE  NEGATIVE   Ketones, ur NEGATIVE  NEGATIVE mg/dL   Protein, ur 30 (*) NEGATIVE mg/dL   Urobilinogen, UA 0.2  0.0 - 1.0 mg/dL   Nitrite NEGATIVE  NEGATIVE   Leukocytes, UA TRACE (*) NEGATIVE  URINE MICROSCOPIC-ADD ON     Status: Abnormal  Collection Time    01/27/14 10:20 AM      Result Value Range   Squamous Epithelial / LPF RARE  RARE   WBC, UA 3-6  <3 WBC/hpf   RBC / HPF 7-10  <3 RBC/hpf   Bacteria, UA FEW (*) RARE   Impression/Plan   Cholecystitis - admit to telemetry, cycle troponin, check EKG, start IVFs and IV antibiotics, general surgery consulted, abdominal ultrasound ordered. Will provide IV medications for pain and nausea, Follow liver enzymes.     Check troponin x 3, repeat cxr in AM   Hypothyroidism - resume home levothyroxine    HTN (hypertension) - monitor, slightly elevated at this time likely related to abdominal pain   Pleural effusion - reported as  mild to moderate per radiologist and not a new finding, will monitor, pt currently asymptomatic, oxygenating well, no respiratory distress   History of throat cancer and breast cancer - has been stable, being monitored for this outpatient by Dr. Humphrey Rolls.    Abdominal pain, acute, right lower quadrant - IV meds for pain and nausea will be ordered.    Nausea and vomiting in adult - IVFs and IV nausea medications    Distended bladder - CT scan reveals distended bladder, will place foley for decompression and follow  Code Status: Full Family Communication: Daughter and Husband at bedside Disposition: medical telemetry  Irwin Brakeman MD [place], Joseph 01/27/2014, 1:41 PM

## 2014-01-27 NOTE — Consult Note (Signed)
I think she will most likely need cholecystectomy and will plan to do tomorrow pending u/s.  I think this is cholecystitis and nothing more.  We discussed laparoscopic possible open due to size and space issues. Will follow up in am to determine surgery possibly tomorrow. Npo after mn

## 2014-01-27 NOTE — ED Notes (Signed)
PT reports going to bed around 9pm Monday night and started to have chest tightness 5/10 and experienced nausea/vomiting this AM. PT had BMx3 today. Hx of bilateral mastectomy and has restricted L extremity. PT NAD, respirations equal/unlabored, skin warm/dry.

## 2014-01-28 ENCOUNTER — Inpatient Hospital Stay (HOSPITAL_COMMUNITY): Payer: Medicare Other | Admitting: Anesthesiology

## 2014-01-28 ENCOUNTER — Inpatient Hospital Stay (HOSPITAL_COMMUNITY): Payer: Medicare Other

## 2014-01-28 ENCOUNTER — Encounter (HOSPITAL_COMMUNITY): Payer: Self-pay | Admitting: Certified Registered Nurse Anesthetist

## 2014-01-28 ENCOUNTER — Encounter (HOSPITAL_COMMUNITY)
Admission: EM | Disposition: A | Discharge status: Home / Self Care | Payer: Self-pay | Source: Home / Self Care | Attending: Family Medicine

## 2014-01-28 ENCOUNTER — Other Ambulatory Visit (INDEPENDENT_AMBULATORY_CARE_PROVIDER_SITE_OTHER): Payer: Self-pay | Admitting: General Surgery

## 2014-01-28 ENCOUNTER — Encounter (HOSPITAL_COMMUNITY): Payer: Medicare Other | Admitting: Anesthesiology

## 2014-01-28 DIAGNOSIS — K819 Cholecystitis, unspecified: Secondary | ICD-10-CM

## 2014-01-28 DIAGNOSIS — K8010 Calculus of gallbladder with chronic cholecystitis without obstruction: Secondary | ICD-10-CM

## 2014-01-28 DIAGNOSIS — Z85819 Personal history of malignant neoplasm of unspecified site of lip, oral cavity, and pharynx: Secondary | ICD-10-CM

## 2014-01-28 DIAGNOSIS — C50919 Malignant neoplasm of unspecified site of unspecified female breast: Secondary | ICD-10-CM

## 2014-01-28 HISTORY — PX: CHOLECYSTECTOMY: SHX55

## 2014-01-28 LAB — CBC
HCT: 39.6 % (ref 36.0–46.0)
Hemoglobin: 13.5 g/dL (ref 12.0–15.0)
MCH: 32.1 pg (ref 26.0–34.0)
MCHC: 34.1 g/dL (ref 30.0–36.0)
MCV: 94.3 fL (ref 78.0–100.0)
PLATELETS: 131 10*3/uL — AB (ref 150–400)
RBC: 4.2 MIL/uL (ref 3.87–5.11)
RDW: 13.6 % (ref 11.5–15.5)
WBC: 13 10*3/uL — ABNORMAL HIGH (ref 4.0–10.5)

## 2014-01-28 LAB — MRSA PCR SCREENING: MRSA by PCR: NEGATIVE

## 2014-01-28 LAB — COMPREHENSIVE METABOLIC PANEL
ALT: 36 U/L — ABNORMAL HIGH (ref 0–35)
AST: 48 U/L — AB (ref 0–37)
Albumin: 3.3 g/dL — ABNORMAL LOW (ref 3.5–5.2)
Alkaline Phosphatase: 68 U/L (ref 39–117)
BUN: 15 mg/dL (ref 6–23)
CALCIUM: 8.7 mg/dL (ref 8.4–10.5)
CO2: 24 mEq/L (ref 19–32)
Chloride: 103 mEq/L (ref 96–112)
Creatinine, Ser: 0.62 mg/dL (ref 0.50–1.10)
GFR calc Af Amer: >90 mL/min (ref >90)
GFR calc non Af Amer: 83 mL/min — ABNORMAL LOW (ref >90)
Glucose, Bld: 142 mg/dL — ABNORMAL HIGH (ref 70–99)
Potassium: 3.9 mEq/L (ref 3.7–5.3)
Sodium: 141 mEq/L (ref 137–147)
TOTAL PROTEIN: 7 g/dL (ref 6.0–8.3)
Total Bilirubin: 0.9 mg/dL (ref 0.3–1.2)

## 2014-01-28 LAB — TSH: TSH: 0.632 u[IU]/mL (ref 0.350–4.500)

## 2014-01-28 LAB — URINE CULTURE: Colony Count: 9000

## 2014-01-28 LAB — PROTIME-INR
INR: 1.2 (ref 0.00–1.49)
Prothrombin Time: 14.9 seconds (ref 11.6–15.2)

## 2014-01-28 SURGERY — LAPAROSCOPIC CHOLECYSTECTOMY
Anesthesia: General | Site: Abdomen

## 2014-01-28 MED ORDER — IOHEXOL 300 MG/ML  SOLN
INTRAMUSCULAR | Status: DC | PRN
  Administered 2014-01-28: 13:00:00

## 2014-01-28 MED ORDER — 0.9 % SODIUM CHLORIDE (POUR BTL) OPTIME
TOPICAL | Status: DC | PRN
  Administered 2014-01-28: 1000 mL

## 2014-01-28 MED ORDER — NEOSTIGMINE METHYLSULFATE 1 MG/ML IJ SOLN
INTRAMUSCULAR | Status: DC | PRN
  Administered 2014-01-28: 4 mg via INTRAVENOUS

## 2014-01-28 MED ORDER — DEXAMETHASONE SODIUM PHOSPHATE 4 MG/ML IJ SOLN
INTRAMUSCULAR | Status: DC | PRN
  Administered 2014-01-28: 4 mg via INTRAVENOUS

## 2014-01-28 MED ORDER — LIDOCAINE HCL (CARDIAC) 20 MG/ML IV SOLN
INTRAVENOUS | Status: AC
  Filled 2014-01-28: qty 5

## 2014-01-28 MED ORDER — PROMETHAZINE HCL 25 MG/ML IJ SOLN: 6.2500 mg | INTRAMUSCULAR | Status: DC | PRN

## 2014-01-28 MED ORDER — LACTATED RINGERS IV SOLN
INTRAVENOUS | Status: DC
  Administered 2014-01-28: 50 via INTRAVENOUS

## 2014-01-28 MED ORDER — BUPIVACAINE-EPINEPHRINE (PF) 0.25% -1:200000 IJ SOLN
INTRAMUSCULAR | Status: AC
  Filled 2014-01-28: qty 30

## 2014-01-28 MED ORDER — LIDOCAINE HCL (CARDIAC) 20 MG/ML IV SOLN
INTRAVENOUS | Status: DC | PRN
  Administered 2014-01-28: 40 mg via INTRAVENOUS

## 2014-01-28 MED ORDER — PROPOFOL 10 MG/ML IV BOLUS
INTRAVENOUS | Status: AC
  Filled 2014-01-28: qty 20

## 2014-01-28 MED ORDER — BUPIVACAINE-EPINEPHRINE 0.25% -1:200000 IJ SOLN
INTRAMUSCULAR | Status: DC | PRN
Start: 2014-01-28 — End: 2014-01-28
  Administered 2014-01-28: 10 mL

## 2014-01-28 MED ORDER — LACTATED RINGERS IV SOLN
INTRAVENOUS | Status: DC | PRN
  Administered 2014-01-28: 13:00:00 via INTRAVENOUS

## 2014-01-28 MED ORDER — GLYCOPYRROLATE 0.2 MG/ML IJ SOLN
INTRAMUSCULAR | Status: DC | PRN
  Administered 2014-01-28: 0.6 mg via INTRAVENOUS

## 2014-01-28 MED ORDER — PHENYLEPHRINE HCL 10 MG/ML IJ SOLN
INTRAMUSCULAR | Status: DC | PRN
  Administered 2014-01-28: 80 ug via INTRAVENOUS

## 2014-01-28 MED ORDER — SODIUM CHLORIDE 0.9 % IR SOLN
Status: DC | PRN
  Administered 2014-01-28: 1000 mL

## 2014-01-28 MED ORDER — OXYCODONE HCL 5 MG/5ML PO SOLN: 5.0000 mg | Freq: Once | ORAL | Status: DC | PRN

## 2014-01-28 MED ORDER — DEXAMETHASONE SODIUM PHOSPHATE 4 MG/ML IJ SOLN
INTRAMUSCULAR | Status: AC
  Filled 2014-01-28: qty 1

## 2014-01-28 MED ORDER — HEMOSTATIC AGENTS (NO CHARGE) OPTIME
TOPICAL | Status: DC | PRN
  Administered 2014-01-28 (×2): 1 via TOPICAL

## 2014-01-28 MED ORDER — HYDROMORPHONE HCL PF 1 MG/ML IJ SOLN: 0.2500 mg | INTRAMUSCULAR | Status: DC | PRN

## 2014-01-28 MED ORDER — ROCURONIUM BROMIDE 100 MG/10ML IV SOLN
INTRAVENOUS | Status: DC | PRN
  Administered 2014-01-28: 10 mg via INTRAVENOUS
  Administered 2014-01-28: 30 mg via INTRAVENOUS

## 2014-01-28 MED ORDER — PROPOFOL 10 MG/ML IV BOLUS
INTRAVENOUS | Status: DC | PRN
  Administered 2014-01-28: 140 mg via INTRAVENOUS

## 2014-01-28 MED ORDER — FENTANYL CITRATE 0.05 MG/ML IJ SOLN
INTRAMUSCULAR | Status: DC | PRN
  Administered 2014-01-28: 100 ug via INTRAVENOUS
  Administered 2014-01-28: 50 ug via INTRAVENOUS
  Administered 2014-01-28: 25 ug via INTRAVENOUS

## 2014-01-28 MED ORDER — FENTANYL CITRATE 0.05 MG/ML IJ SOLN
INTRAMUSCULAR | Status: AC
  Filled 2014-01-28: qty 5

## 2014-01-28 MED ORDER — ONDANSETRON HCL 4 MG/2ML IJ SOLN
INTRAMUSCULAR | Status: DC | PRN
  Administered 2014-01-28: 4 mg via INTRAVENOUS

## 2014-01-28 MED ORDER — PHENYLEPHRINE 40 MCG/ML (10ML) SYRINGE FOR IV PUSH (FOR BLOOD PRESSURE SUPPORT)
PREFILLED_SYRINGE | INTRAVENOUS | Status: AC
  Filled 2014-01-28: qty 10

## 2014-01-28 MED ORDER — OXYCODONE HCL 5 MG PO TABS: 5.0000 mg | ORAL_TABLET | Freq: Once | ORAL | Status: DC | PRN

## 2014-01-28 SURGICAL SUPPLY — 46 items
APPLIER CLIP 5 13 M/L LIGAMAX5 (MISCELLANEOUS) ×4
BLADE SURG ROTATE 9660 (MISCELLANEOUS) IMPLANT
CANISTER SUCTION 2500CC (MISCELLANEOUS) ×4 IMPLANT
CHLORAPREP W/TINT 26ML (MISCELLANEOUS) ×4 IMPLANT
CLIP APPLIE 5 13 M/L LIGAMAX5 (MISCELLANEOUS) ×2 IMPLANT
COVER MAYO STAND STRL (DRAPES) IMPLANT
COVER SURGICAL LIGHT HANDLE (MISCELLANEOUS) ×4 IMPLANT
DECANTER SPIKE VIAL GLASS SM (MISCELLANEOUS) IMPLANT
DERMABOND ADVANCED (GAUZE/BANDAGES/DRESSINGS) ×2
DERMABOND ADVANCED .7 DNX12 (GAUZE/BANDAGES/DRESSINGS) ×2 IMPLANT
DRAIN CHANNEL 19F RND (DRAIN) ×4 IMPLANT
DRAPE C-ARM 42X72 X-RAY (DRAPES) IMPLANT
ELECT REM PT RETURN 9FT ADLT (ELECTROSURGICAL) ×4
ELECTRODE REM PT RTRN 9FT ADLT (ELECTROSURGICAL) ×2 IMPLANT
EVACUATOR SILICONE 100CC (DRAIN) ×4 IMPLANT
GLOVE BIO SURGEON STRL SZ7 (GLOVE) ×4 IMPLANT
GLOVE BIO SURGEON STRL SZ7.5 (GLOVE) ×12 IMPLANT
GLOVE BIOGEL PI IND STRL 7.0 (GLOVE) ×4 IMPLANT
GLOVE BIOGEL PI IND STRL 7.5 (GLOVE) ×6 IMPLANT
GLOVE BIOGEL PI IND STRL 8 (GLOVE) ×2 IMPLANT
GLOVE BIOGEL PI INDICATOR 7.0 (GLOVE) ×4
GLOVE BIOGEL PI INDICATOR 7.5 (GLOVE) ×6
GLOVE BIOGEL PI INDICATOR 8 (GLOVE) ×2
GLOVE SURG SS PI 7.0 STRL IVOR (GLOVE) ×4 IMPLANT
GOWN STRL NON-REIN LRG LVL3 (GOWN DISPOSABLE) ×20 IMPLANT
HEMOSTAT SNOW SURGICEL 2X4 (HEMOSTASIS) ×8 IMPLANT
KIT BASIN OR (CUSTOM PROCEDURE TRAY) ×4 IMPLANT
KIT ROOM TURNOVER OR (KITS) ×4 IMPLANT
NS IRRIG 1000ML POUR BTL (IV SOLUTION) ×4 IMPLANT
PAD ARMBOARD 7.5X6 YLW CONV (MISCELLANEOUS) ×8 IMPLANT
POUCH RETRIEVAL ECOSAC 10 (ENDOMECHANICALS) ×2 IMPLANT
POUCH RETRIEVAL ECOSAC 10MM (ENDOMECHANICALS) ×2
SCISSORS LAP 5X35 DISP (ENDOMECHANICALS) ×4 IMPLANT
SET CHOLANGIOGRAPH 5 50 .035 (SET/KITS/TRAYS/PACK) IMPLANT
SET IRRIG TUBING LAPAROSCOPIC (IRRIGATION / IRRIGATOR) ×8 IMPLANT
SLEEVE ENDOPATH XCEL 5M (ENDOMECHANICALS) ×8 IMPLANT
SPECIMEN JAR LARGE (MISCELLANEOUS) ×4 IMPLANT
SPECIMEN JAR SMALL (MISCELLANEOUS) IMPLANT
SUT ETHILON 2 0 FS 18 (SUTURE) ×8 IMPLANT
SUT MNCRL AB 4-0 PS2 18 (SUTURE) ×4 IMPLANT
SUT VICRYL 0 UR6 27IN ABS (SUTURE) ×4 IMPLANT
TOWEL OR 17X24 6PK STRL BLUE (TOWEL DISPOSABLE) ×4 IMPLANT
TOWEL OR 17X26 10 PK STRL BLUE (TOWEL DISPOSABLE) ×4 IMPLANT
TRAY LAPAROSCOPIC (CUSTOM PROCEDURE TRAY) ×4 IMPLANT
TROCAR XCEL BLUNT TIP 100MML (ENDOMECHANICALS) ×4 IMPLANT
TROCAR XCEL NON-BLD 5MMX100MML (ENDOMECHANICALS) ×4 IMPLANT

## 2014-01-28 NOTE — Op Note (Signed)
Preoperative diagnosis: acute cholecystitis Postoperative diagnosis: same as above Procedure: Laparoscopic cholecystectomy Surgeon: Dr Serita Grammes Asst: Dr Modena Jansky EBL: 50 cc Anesthesia: general Drains: 45 Fr Blake to gb fossa Specimen: gb and contents to pathology Dispo to recovery stable Sponge and needle count correct times two  Indications:  This is an 53 yof who presents with a palpable gallbladder and signs and symptoms of acute cholecystitis.  This will be difficult due to the fact that her gallbladder is down to her iliac crest. We discussed an attempt at a laparoscopic cholecystectomy with possible open cholecystectomy. We discussed the risks and benefits associated with it.  Procedure: After informed consent was obtained the patient was taken to the operating room. She was on Unasyn already on the floor. Sequential compression devices on her legs. She was placed under general anesthesia without complication. Her abdomen was prepped and draped in the standard sterile surgical fashion. A surgical timeout was then performed.  I made an incision several centimeters below her umbilicus. I went much lower than usual due to the fact that her gallbladder was very low and due her body habitus as well. I identified the fascia. I incised it sharply and entered the peritoneum bluntly. I placed a 0 Vicryl pursestring suture through the fascia. I was able to insert my finger and the gallbladder was palpable below her umbilicus. I then inserted an epigastric 5 mm trocar and insufflated the abdomen to 15 mm mercury pressure. I then inserted a 5 mm trocar in the epigastrium under direct vision without complication. I then aspirated a fair amount of clear-appearing fluid from what was a hydropic gallbladder. This allowed medial to insert 2 other trocars in the right side of the abdomen to retract the gallbladder cephalad and lateral. It took me about 30 minutes to be able to identify the critical  view of safety. I was able to both identify the cystic duct and the cystic artery very clearly. I elected not to do a cholangiogram as I was sure of my anatomy and I was concerned about placing a catheter down the short cystic duct. I then clipped both of these leaving 3 clips in place on both of them.The duct was viable and the clips completely crossed. The gallbladder had a very significant plane of edema between it and the liver. It was very friable. I did not enter the gallbladder during the procedure. I then removed the gallbladder from the liver bed with some difficulty. I placed then inside the bag. The gallbladder was so big it barely fit in the bag but I was able to secure the top of the bag. I then had to enlarge my umbilical incision a little bit and then I removed the gallbladder. I then placed 2-0 Vicryl pursestring sutures through the fascia to close down my umbilical incision. I reinserted my camera. I obtained hemostasis. I evacuated the irrigant. I then placed 2 pieces of Surgicel Snow and the gallbladder bed where it was turned friable. I then placed a 15 [place] Blake drain through the right lower quadrant. I secured this with a 2-0 nylon. I then tied down by the local suture removed, trocars. These are then closed with 4-0 Monocryl Dermabond. She tolerated this well was extubated and transferred to recovery stable.

## 2014-01-28 NOTE — Progress Notes (Signed)
UR Completed.  [name] [phone] 01/28/2014

## 2014-01-28 NOTE — Anesthesia Preprocedure Evaluation (Signed)
Anesthesia Evaluation  Patient identified by MRN, date of birth, ID band Patient awake    Reviewed: Allergy & Precautions, H&P , NPO status , Patient's Chart, lab work & pertinent test results  History of Anesthesia Complications Negative for: history of anesthetic complications  Airway Mallampati: II TM Distance: >3 FB Neck ROM: Full    Dental  (+) Dental Advisory Given, Partial Upper and Missing   Pulmonary neg pulmonary ROS,    Pulmonary exam normal       Cardiovascular hypertension,     Neuro/Psych negative neurological ROS  negative psych ROS   GI/Hepatic Neg liver ROS,   Endo/Other  Hypothyroidism   Renal/GU negative Renal ROS     Musculoskeletal   Abdominal   Peds  Hematology   Anesthesia Other Findings   Reproductive/Obstetrics                           Anesthesia Physical Anesthesia Plan  ASA: III  Anesthesia Plan: General   Post-op Pain Management:    Induction: Intravenous  Airway Management Planned: Oral ETT  Additional Equipment:   Intra-op Plan:   Post-operative Plan: Extubation in OR  Informed Consent: I have reviewed the patients History and Physical, chart, labs and discussed the procedure including the risks, benefits and alternatives for the proposed anesthesia with the patient or authorized representative who has indicated his/her understanding and acceptance.   Dental advisory given  Plan Discussed with: CRNA, Anesthesiologist and Surgeon  Anesthesia Plan Comments:         Anesthesia Quick Evaluation

## 2014-01-28 NOTE — Anesthesia Postprocedure Evaluation (Signed)
  Anesthesia Post-op Note  Patient: Veronica Pierce  Procedure(s) Performed: Procedure(s): LAPAROSCOPIC CHOLECYSTECTOMY (N/A)  Patient Location: PACU  Anesthesia Type:General  Level of Consciousness: awake  Airway and Oxygen Therapy: Patient Spontanous Breathing  Post-op Pain: mild  Post-op Assessment: Post-op Vital signs reviewed  Post-op Vital Signs: stable  Complications: No apparent anesthesia complications

## 2014-01-28 NOTE — Transfer of Care (Signed)
Immediate Anesthesia Transfer of Care Note  Patient: Veronica Pierce  Procedure(s) Performed: Procedure(s): LAPAROSCOPIC CHOLECYSTECTOMY (N/A)  Patient Location: PACU  Anesthesia Type:General  Level of Consciousness: sedated  Airway & Oxygen Therapy: Patient Spontanous Breathing and Patient connected to nasal cannula oxygen  Post-op Assessment: Report given to PACU RN, Post -op Vital signs reviewed and stable and Patient moving all extremities  Post vital signs: Reviewed and stable  Complications: No apparent anesthesia complications

## 2014-01-28 NOTE — Progress Notes (Signed)
Subjective: Still with pain unchanged  Objective: Vital signs in last 24 hours: Temp:  [97.9 F (36.6 C)-99.8 F (37.7 C)] 99.8 F (37.7 C) (02/04 0418) Pulse Rate:  [77-94] 78 (02/04 0418) Resp:  [14-25] 18 (02/04 0418) BP: (126-179)/(56-77) 139/65 mmHg (02/04 0418) SpO2:  [95 %-100 %] 95 % (02/04 0418) Weight:  [102 lb (46.267 kg)] 102 lb (46.267 kg) (02/03 0817) Last BM Date: 01/27/14  Intake/Output from previous day: 02/03 0701 - 02/04 0700 In: 646.3 [I.V.:646.3] Out: -  Intake/Output this shift: Palpable gb in rlq with murphys sign  Lab Results:   Recent Labs  01/27/14 1409 01/28/14 0300  WBC 13.8* 13.0*  HGB 14.3 13.5  HCT 41.2 39.6  PLT 160 131*   BMET  Recent Labs  01/27/14 0840 01/27/14 1409 01/28/14 0300  NA 140  --  141  K 4.4  --  3.9  CL 97  --  103  CO2 26  --  24  GLUCOSE 186*  --  142*  BUN 20  --  15  CREATININE 0.69 0.65 0.62  CALCIUM 9.9  --  8.7   PT/INR  Recent Labs  01/27/14 0840 01/28/14 0300  LABPROT 13.4 14.9  INR 1.04 1.20   ABG No results found for this basename: PHART, PCO2, PO2, HCO3,  in the last 72 hours  Studies/Results: Ct Angio Chest Pe W/cm &/or Wo Cm  01/27/2014   CLINICAL DATA:  Chest pain with history of throat and breast malignancy  EXAM: CT ANGIOGRAPHY CHEST WITH CONTRAST  TECHNIQUE: Multidetector CT imaging of the chest was performed using the standard protocol during bolus administration of intravenous contrast. Multiplanar CT image reconstructions including MIPs were obtained to evaluate the vascular anatomy.  CONTRAST:  53mL OMNIPAQUE IOHEXOL 350 MG/ML SOLN  COMPARISON:  CT CHEST W/CM dated 12/02/2013  FINDINGS: Contrast within the pulmonary arterial tree is normal in appearance. There are no filling defects to suggest an acute pulmonary embolism. The caliber of the thoracic aorta is normal. There is no evidence of a false lumen. There is a moderate-sized right pleural effusion which has increased slightly  in volume since the previous study. There is no left pleural effusion. There is no pericardial effusion. The cardiac chambers are top-normal in size. No bulky mediastinal lymph nodes are present. There is soft tissue fullness in the right suprahilar region which is stable.  Within the upper abdomen the observed portions of the liver and spleen appear normal. There are no adrenal masses.  At lung window settings there is extensive pleural and parenchymal abnormality in the right upper lobe. This has not greatly changed since the previous study. There is also pleural-based density anteriorly and laterally in the right middle lobe. The left upper lobe demonstrates stable pleural-based increased density superiorly. Elsewhere within the left lung no significant parenchymal abnormality is demonstrated.  Within the upper abdomen the observed portions of the liver and spleen appear normal. The known L1 compression is only partially included in the field of view. No thoracic vertebral body fracture is demonstrated. The sternum appears intact. No acute rib lesion is demonstrated.  Review of the MIP images confirms the above findings.  IMPRESSION: 1. There is no evidence of an acute pulmonary embolism nor acute thoracic aortic pathology. 2. The moderate-sized right pleural effusion has increased in size since the previous study. 3. There is a stable appearance of the pleural and parenchymal scarring bilaterally but most conspicuously in the right upper lobe. Stable bronchiectatic changes in  the right upper lobe are demonstrated. 4. No bulky mediastinal or hilar lymph adenopathy is demonstrated.   Electronically Signed   By: David  Martinique   On: 01/27/2014 11:06   US Abdomen Complete  01/27/2014   CLINICAL DATA:  Abnormal gallbladder by CT scan.  EXAM: ULTRASOUND ABDOMEN COMPLETE  COMPARISON:  CT chest, abdomen and pelvis 01/27/2014 at 10:45 a.m.  FINDINGS: Gallbladder:  Gallbladder is markedly distended. A 2.5 x 1.1 x 1.4 cm  stone is identified in the neck of the gallbladder with sludge present. Pericholecystic fluid is identified. Gallbladder wall thickness measures approximately 0.5 cm. Sonographer reports a positive Murphy's sign.  Common bile duct:  Diameter:  0.9 cm  Liver:  No focal lesion identified. Within normal limits in parenchymal echogenicity.  IVC:  No abnormality visualized.  Pancreas:  Visualized portion unremarkable.  Spleen:  Size and appearance within normal limits.  Right Kidney:  Length: 10.4 cm. No stone, mass or hydronephrosis is identified. Extra renal pelvis is noted.  Left Kidney:  Length: 10.6 cm.  Parapelvic cysts are noted.  Abdominal aorta:  No aneurysm visualized.  Other findings:  None.  IMPRESSION: Large gallstone appears to be impacted in the neck of the gallbladder. Findings compatible with acute cholecystitis are identified including gallbladder wall thickening, pericholecystic fluid and sonographic Murphy's sign.   Electronically Signed   By: Inge Rise M.D.   On: 01/27/2014 20:53   Ct Abdomen Pelvis W Contrast  01/27/2014   CLINICAL DATA:  History of throat and breast malignancy, chest pain and dyspnea  EXAM: CT ABDOMEN AND PELVIS WITH CONTRAST  TECHNIQUE: Multidetector CT imaging of the abdomen and pelvis was performed using the standard protocol following bolus administration of intravenous contrast.  CONTRAST:  89mL OMNIPAQUE IOHEXOL 350 MG/ML SOLN intravenously. The patient also received oral contrast material.  COMPARISON:  CT scan of the chest dated December 02, 2013.  FINDINGS: The liver exhibits no focal mass nor ductal dilation. The gallbladder is markedly distended today. It was not fully included in the field of view on the previous study and thus direct comparison is not possible. There is mild gallbladder wall thickening. No pericholecystic fluid is evident. The pancreas, spleen, and adrenal glands are normal in appearance. The enhancement blush of the renal cortex is prominent  bilaterally likely due to the early phase of the arterial injection for the accompanying PE study. There is a midpole hypodensity measuring approximately 7 x 10 mm in the right midpole most compatible with a cyst. There are extrarenal pelves bilaterally.  The caliber of the abdominal aorta is normal. The partially contrast-filled loops of small and large bowel exhibit no evidence of ileus nor of obstruction. No evidence of colitis or diverticulitis is demonstrated. A structure which likely reflects an air-filled normal calibered appendix is demonstrated in the right aspect of the pelvis. Urinary bladder is distended. The uterus is small appropriate for age. There is a cystic appearing left adnexal mass measuring approximately 4 cm in diameter and exhibiting a Hounsfield measurement of -29. There is no evidence of ascites. There is no inguinal or umbilical hernia.  The body of L1 exhibits moderate compression with loss of height anteriorly of 30%. There is mild retropulsion of the posterior superior cortex. There is diffusely increased density within the L1 vertebral body. The other vertebral bodies are preserved in height and demonstrate normal density. The bony pelvis exhibits no suspicious lytic or blastic lesions.  IMPRESSION: 1. There is abnormal distention of the gallbladder  with wall thickening. This may reflect acute cholecystitis. No stones are evident. Right upper quadrant ultrasound would be useful. 2. No acute abnormality of the liver or pancreas or spleen is demonstrated. 3. There is no evidence of acute bowel abnormality nor acute urinary tract abnormality. The urinary bladder is moderately distended and Foley catheterization may be useful if the patient cannot spontaneously void. 4. There is no evidence of ascites. No bulky intra-abdominal or pelvic lymphadenopathy is demonstrated. 5. There is stable wedge compression of the body of L1 with mild retropulsion of bone.   Electronically Signed   By: David   [place]   On: 01/27/2014 11:18   Portable Chest 1 View  01/28/2014   CLINICAL DATA:  Pleural effusion.  Shortness of breath.  EXAM: PORTABLE CHEST - 1 VIEW  COMPARISON:  Chest CT from with 1 day prior.  FINDINGS: No cardiomegaly. Unchanged mediastinal contours, including architectural distortion on the right with rightward bronchial traction. Subpleural airspace opacities in the right mid and upper lung and left apex appears stable. Right upper bronchiectasis again noted. A small right pleural effusion is unchanged. No edema or pneumothorax.  IMPRESSION: Stable right-sided airspace disease and small effusion.   Electronically Signed   By: Jorje Guild M.D.   On: 01/28/2014 05:38    Anti-infectives: Anti-infectives   Start     Dose/Rate Route Frequency Ordered Stop   01/27/14 1500  Ampicillin-Sulbactam (UNASYN) 3 g in sodium chloride 0.9 % 100 mL IVPB     3 g 100 mL/hr over 60 Minutes Intravenous Every 6 hours 01/27/14 1459        Assessment/Plan: Acute cholecystitis  Plan for attempted lap chole today.  I don't think other options at all reasonable.  Issue is size of gb and working space.  I discussed possible open chole with her with higher than normal rate.  Will try to drain gb first with more port sites than usual then do lap chole.   We discussed the risks and benefits of a laparoscopic cholecystectomy and possible cholangiogram including, but not limited to bleeding, infection, injury to surrounding structures such as the intestine or liver, bile leak,need to convert to an open procedure, prolonged diarrhea, blood clots such as  DVT, common bile duct injury.  Will plan surgery later today.   [facility] 01/28/2014

## 2014-01-28 NOTE — Progress Notes (Signed)
[facility] PROGRESS NOTE  Veronica Pierce WVP:[id number] DOB: Aug 17, 1931 DOA: 01/27/2014 PCP: Arnette Norris, MD  Assessment/Plan: Principal Problem:   Cholecystitis - Pt is s/p lap chole - Will reassess next am - General surgery on board.  Active Problems:   Hypothyroidism - stable, TSH WNL, continue home Synthroid regimen    History of throat cancer/History of breast cancer in female - Patient to continue routine followup with oncologist on discharge.    Nausea and vomiting in adult -Secondary to primary problem list above   Code Status:full Family Communication: Discussed with family at bedside Disposition Plan:    Consultants:  General surgery Dr. Donne Hazel  Procedures:  Laparoscopic cholecystectomy  Antibiotics:  Unasyn  HPI/Subjective: Patient has no new complaints. Pain tolerable  Objective: Filed Vitals:   01/28/14 1817  BP: 137/56  Pulse: 83  Temp: 98 F (36.7 C)  Resp: 18    Intake/Output Summary (Last 24 hours) at 01/28/14 1843 Last data filed at 01/28/14 1757  Gross per 24 hour  Intake 2546.25 ml  Output     50 ml  Net 2496.25 ml   Filed Weights   01/27/14 0817  Weight: 46.267 kg (102 lb)    Exam:   General:  Pt in NAD, Alert and AWake  Cardiovascular: RRR, no MRG  Respiratory: CTA BL, no wheezes  Abdomen: incisions intact, no active bleeding, drain in place draining serosanguinous fluid  Musculoskeletal: no cyanosis or clubbing   Data Reviewed: Basic Metabolic Panel:  Recent Labs Lab 01/27/14 0840 01/27/14 1409 01/28/14 0300  NA 140  --  141  K 4.4  --  3.9  CL 97  --  103  CO2 26  --  24  GLUCOSE 186*  --  142*  BUN 20  --  15  CREATININE 0.69 0.65 0.62  CALCIUM 9.9  --  8.7   Liver Function Tests:  Recent Labs Lab 01/27/14 0840 01/28/14 0300  AST 22 48*  ALT 14 36*  ALKPHOS 66 68  BILITOT 0.4 0.9  PROT 8.0 7.0  ALBUMIN 4.0 3.3*    Recent Labs Lab 01/27/14 0840  LIPASE 46   No results found  for this basename: AMMONIA,  in the last 168 hours CBC:  Recent Labs Lab 01/27/14 0840 01/27/14 1409 01/28/14 0300  WBC 10.1 13.8* 13.0*  NEUTROABS 9.5*  --   --   HGB 14.1 14.3 13.5  HCT 40.6 41.2 39.6  MCV 93.3 93.6 94.3  PLT 155 160 131*   Cardiac Enzymes:  Recent Labs Lab 01/27/14 0840 01/27/14 1455 01/27/14 2105  TROPONINI <0.30 <0.30 <0.30   BNP (last 3 results) No results found for this basename: PROBNP,  in the last 8760 hours CBG: No results found for this basename: GLUCAP,  in the last 168 hours  Recent Results (from the past 240 hour(s))  URINE CULTURE     Status: None   Collection Time    01/27/14 10:20 AM      Result Value Range Status   Specimen Description URINE, RANDOM   Final   Special Requests NONE   Final   Culture  Setup Time     Final   Value: 01/27/2014 14:03     Performed at [facility]     Final   Value: 9,000 COLONIES/ML     Performed at [facility]   Culture     Final   Value: INSIGNIFICANT GROWTH     Performed at  [facility]   Report Status 01/28/2014 FINAL   Final  MRSA PCR SCREENING     Status: None   Collection Time    01/28/14  9:22 AM      Result Value Range Status   MRSA by PCR NEGATIVE  NEGATIVE Final   Comment:            The GeneXpert MRSA Assay (FDA     approved for NASAL specimens     only), is one component of a     comprehensive MRSA colonization     surveillance program. It is not     intended to diagnose MRSA     infection nor to guide or     monitor treatment for     MRSA infections.     Studies: Ct Angio Chest Pe W/cm &/or Wo Cm  01/27/2014   CLINICAL DATA:  Chest pain with history of throat and breast malignancy  EXAM: CT ANGIOGRAPHY CHEST WITH CONTRAST  TECHNIQUE: Multidetector CT imaging of the chest was performed using the standard protocol during bolus administration of intravenous contrast. Multiplanar CT image reconstructions including MIPs were obtained to  evaluate the vascular anatomy.  CONTRAST:  80mL OMNIPAQUE IOHEXOL 350 MG/ML SOLN  COMPARISON:  CT CHEST W/CM dated 12/02/2013  FINDINGS: Contrast within the pulmonary arterial tree is normal in appearance. There are no filling defects to suggest an acute pulmonary embolism. The caliber of the thoracic aorta is normal. There is no evidence of a false lumen. There is a moderate-sized right pleural effusion which has increased slightly in volume since the previous study. There is no left pleural effusion. There is no pericardial effusion. The cardiac chambers are top-normal in size. No bulky mediastinal lymph nodes are present. There is soft tissue fullness in the right suprahilar region which is stable.  Within the upper abdomen the observed portions of the liver and spleen appear normal. There are no adrenal masses.  At lung window settings there is extensive pleural and parenchymal abnormality in the right upper lobe. This has not greatly changed since the previous study. There is also pleural-based density anteriorly and laterally in the right middle lobe. The left upper lobe demonstrates stable pleural-based increased density superiorly. Elsewhere within the left lung no significant parenchymal abnormality is demonstrated.  Within the upper abdomen the observed portions of the liver and spleen appear normal. The known L1 compression is only partially included in the field of view. No thoracic vertebral body fracture is demonstrated. The sternum appears intact. No acute rib lesion is demonstrated.  Review of the MIP images confirms the above findings.  IMPRESSION: 1. There is no evidence of an acute pulmonary embolism nor acute thoracic aortic pathology. 2. The moderate-sized right pleural effusion has increased in size since the previous study. 3. There is a stable appearance of the pleural and parenchymal scarring bilaterally but most conspicuously in the right upper lobe. Stable bronchiectatic changes in the  right upper lobe are demonstrated. 4. No bulky mediastinal or hilar lymph adenopathy is demonstrated.   Electronically Signed   By: David  [place]   On: 01/27/2014 11:06   US Abdomen Complete  01/27/2014   CLINICAL DATA:  Abnormal gallbladder by CT scan.  EXAM: ULTRASOUND ABDOMEN COMPLETE  COMPARISON:  CT chest, abdomen and pelvis 01/27/2014 at 10:45 a.m.  FINDINGS: Gallbladder:  Gallbladder is markedly distended. A 2.5 x 1.1 x 1.4 cm stone is identified in the neck of the gallbladder with sludge present. Pericholecystic  fluid is identified. Gallbladder wall thickness measures approximately 0.5 cm. Sonographer reports a positive Murphy's sign.  Common bile duct:  Diameter:  0.9 cm  Liver:  No focal lesion identified. Within normal limits in parenchymal echogenicity.  IVC:  No abnormality visualized.  Pancreas:  Visualized portion unremarkable.  Spleen:  Size and appearance within normal limits.  Right Kidney:  Length: 10.4 cm. No stone, mass or hydronephrosis is identified. Extra renal pelvis is noted.  Left Kidney:  Length: 10.6 cm.  Parapelvic cysts are noted.  Abdominal aorta:  No aneurysm visualized.  Other findings:  None.  IMPRESSION: Large gallstone appears to be impacted in the neck of the gallbladder. Findings compatible with acute cholecystitis are identified including gallbladder wall thickening, pericholecystic fluid and sonographic Murphy's sign.   Electronically Signed   By: Inge Rise M.D.   On: 01/27/2014 20:53   Ct Abdomen Pelvis W Contrast  01/27/2014   CLINICAL DATA:  History of throat and breast malignancy, chest pain and dyspnea  EXAM: CT ABDOMEN AND PELVIS WITH CONTRAST  TECHNIQUE: Multidetector CT imaging of the abdomen and pelvis was performed using the standard protocol following bolus administration of intravenous contrast.  CONTRAST:  76mL OMNIPAQUE IOHEXOL 350 MG/ML SOLN intravenously. The patient also received oral contrast material.  COMPARISON:  CT scan of the chest dated  December 02, 2013.  FINDINGS: The liver exhibits no focal mass nor ductal dilation. The gallbladder is markedly distended today. It was not fully included in the field of view on the previous study and thus direct comparison is not possible. There is mild gallbladder wall thickening. No pericholecystic fluid is evident. The pancreas, spleen, and adrenal glands are normal in appearance. The enhancement blush of the renal cortex is prominent bilaterally likely due to the early phase of the arterial injection for the accompanying PE study. There is a midpole hypodensity measuring approximately 7 x 10 mm in the right midpole most compatible with a cyst. There are extrarenal pelves bilaterally.  The caliber of the abdominal aorta is normal. The partially contrast-filled loops of small and large bowel exhibit no evidence of ileus nor of obstruction. No evidence of colitis or diverticulitis is demonstrated. A structure which likely reflects an air-filled normal calibered appendix is demonstrated in the right aspect of the pelvis. Urinary bladder is distended. The uterus is small appropriate for age. There is a cystic appearing left adnexal mass measuring approximately 4 cm in diameter and exhibiting a Hounsfield measurement of -29. There is no evidence of ascites. There is no inguinal or umbilical hernia.  The body of L1 exhibits moderate compression with loss of height anteriorly of 30%. There is mild retropulsion of the posterior superior cortex. There is diffusely increased density within the L1 vertebral body. The other vertebral bodies are preserved in height and demonstrate normal density. The bony pelvis exhibits no suspicious lytic or blastic lesions.  IMPRESSION: 1. There is abnormal distention of the gallbladder with wall thickening. This may reflect acute cholecystitis. No stones are evident. Right upper quadrant ultrasound would be useful. 2. No acute abnormality of the liver or pancreas or spleen is  demonstrated. 3. There is no evidence of acute bowel abnormality nor acute urinary tract abnormality. The urinary bladder is moderately distended and Foley catheterization may be useful if the patient cannot spontaneously void. 4. There is no evidence of ascites. No bulky intra-abdominal or pelvic lymphadenopathy is demonstrated. 5. There is stable wedge compression of the body of L1 with mild retropulsion  of bone.   Electronically Signed   By: David  Martin[place]   On: 01/27/2014 11:18   Portable Chest 1 View  01/28/2014   CLINICAL DATA:  Pleural effusion.  Shortness of breath.  EXAM: PORTABLE CHEST - 1 VIEW  COMPARISON:  Chest CT from with 1 day prior.  FINDINGS: No cardiomegaly. Unchanged mediastinal contours, including architectural distortion on the right with rightward bronchial traction. Subpleural airspace opacities in the right mid and upper lung and left apex appears stable. Right upper bronchiectasis again noted. A small right pleural effusion is unchanged. No edema or pneumothorax.  IMPRESSION: Stable right-sided airspace disease and small effusion.   Electronically Signed   By: Jorje Guild M.D.   On: 01/28/2014 05:38    Scheduled Meds: . ampicillin-sulbactam (UNASYN) IV  3 g Intravenous Q6H  . heparin  5,000 Units Subcutaneous Q8H  . letrozole  2.5 mg Oral Daily  . levothyroxine  100 mcg Oral QAC breakfast  . pantoprazole (PROTONIX) IV  40 mg Intravenous Q24H  . pilocarpine  5 mg Oral Q12H  . sodium chloride  3 mL Intravenous Q12H   Continuous Infusions: . sodium chloride 75 mL/hr at 01/28/14 1833     Time spent: > 35 minutes    Velvet Bathe  [facility] Pager [phone] If 7PM-7AM, please contact night-coverage at [email or web address], password [facility] 01/28/2014, 6:43 PM  LOS: 1 day

## 2014-01-28 NOTE — Anesthesia Procedure Notes (Addendum)
Procedure Name: Intubation Date/Time: 01/28/2014 1:29 PM Performed by: Trixie Deis A Pre-anesthesia Checklist: Patient identified, Timeout performed, Emergency Drugs available, Suction available and Patient being monitored Patient Re-evaluated:Patient Re-evaluated prior to inductionOxygen Delivery Method: Circle system utilized Preoxygenation: Pre-oxygenation with 100% oxygen Intubation Type: IV induction Ventilation: Mask ventilation without difficulty Laryngoscope Size: Mac and 3 Grade View: Grade I Tube type: Oral Tube size: 7.0 mm Number of attempts: 1 (DL by paramedic student [place]) Airway Equipment and Method: Stylet Placement Confirmation: ETT inserted through vocal cords under direct vision,  breath sounds checked- equal and bilateral and positive ETCO2 Secured at: 21 cm Tube secured with: Tape Dental Injury: Teeth and Oropharynx as per pre-operative assessment

## 2014-01-29 NOTE — Progress Notes (Signed)
[facility] PROGRESS NOTE  Veronica Pierce CBJ:[id number] DOB: 23-Feb-1931 DOA: 01/27/2014 PCP: Arnette Norris, MD  Assessment/Plan: Principal Problem:   Cholecystitis - Pt is s/p lap chole - General surgery on board- plan to D/C on 01/30/14 if continues to improve, advance diet as tolerated, continue Unasyn  Active Problems:   Hypothyroidism - stable, TSH WNL -continue home Synthroid regimen    History of throat cancer/History of breast cancer in female - Patient to continue routine followup with oncologist upon discharge.    Nausea and vomiting in adult - Resolved -Secondary to primary problem list above   Code Status:full Family Communication: Discussed with patient and family at bedside Disposition Plan: Discharge on 01/30/14 if continues to improve   Consultants:  General surgery Dr. Donne Hazel  Procedures:  Laparoscopic cholecystectomy  Antibiotics:  Unasyn- day 3  HPI/Subjective: Patient has no new complaints. Pain controlled. Up to chair without any difficulties.  Objective: Filed Vitals:   01/29/14 1415  BP: 134/45  Pulse: 73  Temp: 97.7 F (36.5 C)  Resp: 16    Intake/Output Summary (Last 24 hours) at 01/29/14 1500 Last data filed at 01/29/14 1300  Gross per 24 hour  Intake   3420 ml  Output    705 ml  Net   2715 ml   Filed Weights   01/27/14 0817  Weight: 46.267 kg (102 lb)    Exam:   General:  Pt in NAD, Alert and Awake. Appropriate  Cardiovascular: RRR, no MRG  Respiratory: CTA BL, no wheezes  Abdomen: incisions intact, no active bleeding, drain in place to right abd draining serosanguinous fluid. +bowel sounds. Abdomen soft.  Musculoskeletal: no cyanosis or clubbing.  Data Reviewed: Basic Metabolic Panel:  Recent Labs Lab 01/27/14 0840 01/27/14 1409 01/28/14 0300  NA 140  --  141  K 4.4  --  3.9  CL 97  --  103  CO2 26  --  24  GLUCOSE 186*  --  142*  BUN 20  --  15  CREATININE 0.69 0.65 0.62  CALCIUM 9.9  --  8.7    Liver Function Tests:  Recent Labs Lab 01/27/14 0840 01/28/14 0300  AST 22 48*  ALT 14 36*  ALKPHOS 66 68  BILITOT 0.4 0.9  PROT 8.0 7.0  ALBUMIN 4.0 3.3*    Recent Labs Lab 01/27/14 0840  LIPASE 46   No results found for this basename: AMMONIA,  in the last 168 hours CBC:  Recent Labs Lab 01/27/14 0840 01/27/14 1409 01/28/14 0300  WBC 10.1 13.8* 13.0*  NEUTROABS 9.5*  --   --   HGB 14.1 14.3 13.5  HCT 40.6 41.2 39.6  MCV 93.3 93.6 94.3  PLT 155 160 131*   Cardiac Enzymes:  Recent Labs Lab 01/27/14 0840 01/27/14 1455 01/27/14 2105  TROPONINI <0.30 <0.30 <0.30   BNP (last 3 results) No results found for this basename: PROBNP,  in the last 8760 hours CBG: No results found for this basename: GLUCAP,  in the last 168 hours  Recent Results (from the past 240 hour(s))  URINE CULTURE     Status: None   Collection Time    01/27/14 10:20 AM      Result Value Range Status   Specimen Description URINE, RANDOM   Final   Special Requests NONE   Final   Culture  Setup Time     Final   Value: 01/27/2014 14:03     Performed at [facility]  Final   Value: 9,000 COLONIES/ML     Performed at [facility]   Culture     Final   Value: INSIGNIFICANT GROWTH     Performed at [facility]   Report Status 01/28/2014 FINAL   Final  MRSA PCR SCREENING     Status: None   Collection Time    01/28/14  9:22 AM      Result Value Range Status   MRSA by PCR NEGATIVE  NEGATIVE Final   Comment:            The GeneXpert MRSA Assay (FDA     approved for NASAL specimens     only), is one component of a     comprehensive MRSA colonization     surveillance program. It is not     intended to diagnose MRSA     infection nor to guide or     monitor treatment for     MRSA infections.     Studies: US Abdomen Complete  01/27/2014   CLINICAL DATA:  Abnormal gallbladder by CT scan.  EXAM: ULTRASOUND ABDOMEN COMPLETE  COMPARISON:  CT  chest, abdomen and pelvis 01/27/2014 at 10:45 a.m.  FINDINGS: Gallbladder:  Gallbladder is markedly distended. A 2.5 x 1.1 x 1.4 cm stone is identified in the neck of the gallbladder with sludge present. Pericholecystic fluid is identified. Gallbladder wall thickness measures approximately 0.5 cm. Sonographer reports a positive Murphy's sign.  Common bile duct:  Diameter:  0.9 cm  Liver:  No focal lesion identified. Within normal limits in parenchymal echogenicity.  IVC:  No abnormality visualized.  Pancreas:  Visualized portion unremarkable.  Spleen:  Size and appearance within normal limits.  Right Kidney:  Length: 10.4 cm. No stone, mass or hydronephrosis is identified. Extra renal pelvis is noted.  Left Kidney:  Length: 10.6 cm.  Parapelvic cysts are noted.  Abdominal aorta:  No aneurysm visualized.  Other findings:  None.  IMPRESSION: Large gallstone appears to be impacted in the neck of the gallbladder. Findings compatible with acute cholecystitis are identified including gallbladder wall thickening, pericholecystic fluid and sonographic Murphy's sign.   Electronically Signed   By: Inge Rise M.D.   On: 01/27/2014 20:53   Portable Chest 1 View  01/28/2014   CLINICAL DATA:  Pleural effusion.  Shortness of breath.  EXAM: PORTABLE CHEST - 1 VIEW  COMPARISON:  Chest CT from with 1 day prior.  FINDINGS: No cardiomegaly. Unchanged mediastinal contours, including architectural distortion on the right with rightward bronchial traction. Subpleural airspace opacities in the right mid and upper lung and left apex appears stable. Right upper bronchiectasis again noted. A small right pleural effusion is unchanged. No edema or pneumothorax.  IMPRESSION: Stable right-sided airspace disease and small effusion.   Electronically Signed   By: Jorje Guild M.D.   On: 01/28/2014 05:38    Scheduled Meds: . ampicillin-sulbactam (UNASYN) IV  3 g Intravenous Q6H  . heparin  5,000 Units Subcutaneous Q8H  . letrozole   2.5 mg Oral Daily  . levothyroxine  100 mcg Oral QAC breakfast  . pantoprazole (PROTONIX) IV  40 mg Intravenous Q24H  . pilocarpine  5 mg Oral Q12H  . sodium chloride  3 mL Intravenous Q12H   Continuous Infusions: . sodium chloride 75 mL/hr (01/29/14 1028)     Time spent: > 35 minutes    Larwance Sachs  [facility] Pager [phone] If 7PM-7AM, please contact night-coverage at [email or web address], password [facility] 01/29/2014,  3:00 PM  LOS: 2 days

## 2014-01-29 NOTE — Care Management Note (Unsigned)
    Page 1 of 1   01/29/2014     4:08:12 PM   CARE MANAGEMENT NOTE 01/29/2014  Patient:  Veronica Pierce, Veronica Pierce   Account Number:  [id number]  Date Initiated:  01/29/2014  Documentation initiated by:  Mardell Suttles  Subjective/Objective Assessment:   PT ADM ON 2/3 WITH ABD PAIN.  LAP CHOLEY 2/4.  PTA, PT INDEPENDENT, LIVES WITH SPOUSE     Action/Plan:   WILL FOLLOW FOR DC NEEDS AS PT PROGRESSES.   Anticipated DC Date:  01/30/2014   Anticipated DC Plan:  [place]  CM consult      Choice offered to / List presented to:             Status of service:  In process, will continue to follow Medicare Important Message given?   (If response is "NO", the following Medicare IM given date fields will be blank) Date Medicare IM given:   Date Additional Medicare IM given:    Discharge Disposition:    Per UR Regulation:  Reviewed for med. necessity/level of care/duration of stay  If discussed at [place] of Stay Meetings, dates discussed:    Comments:

## 2014-01-29 NOTE — Progress Notes (Signed)
1 Day Post-Op  Subjective: tol clears, no n/v, feels well  Objective: Vital signs in last 24 hours: Temp:  [97.9 F (36.6 C)-99 F (37.2 C)] 98.3 F (36.8 C) (02/05 0540) Pulse Rate:  [65-87] 71 (02/05 0540) Resp:  [13-21] 18 (02/05 0540) BP: (125-169)/(48-68) 125/51 mmHg (02/05 0540) SpO2:  [93 %-100 %] 98 % (02/05 0540) Last BM Date: 01/27/14  Intake/Output from previous day: 02/04 0701 - 02/05 0700 In: 3300 [P.O.:600; I.V.:2700] Out: 705 [Urine:550; Drains:155] Intake/Output this shift:    GI: soft approp tender some bs, drain serosang wounds clean  Lab Results:   Recent Labs  01/27/14 1409 01/28/14 0300  WBC 13.8* 13.0*  HGB 14.3 13.5  HCT 41.2 39.6  PLT 160 131*   BMET  Recent Labs  01/27/14 0840 01/27/14 1409 01/28/14 0300  NA 140  --  141  K 4.4  --  3.9  CL 97  --  103  CO2 26  --  24  GLUCOSE 186*  --  142*  BUN 20  --  15  CREATININE 0.69 0.65 0.62  CALCIUM 9.9  --  8.7   PT/INR  Recent Labs  01/27/14 0840 01/28/14 0300  LABPROT 13.4 14.9  INR 1.04 1.20   ABG No results found for this basename: PHART, PCO2, PO2, HCO3,  in the last 72 hours  Studies/Results: Ct Angio Chest Pe W/cm &/or Wo Cm  01/27/2014   CLINICAL DATA:  Chest pain with history of throat and breast malignancy  EXAM: CT ANGIOGRAPHY CHEST WITH CONTRAST  TECHNIQUE: Multidetector CT imaging of the chest was performed using the standard protocol during bolus administration of intravenous contrast. Multiplanar CT image reconstructions including MIPs were obtained to evaluate the vascular anatomy.  CONTRAST:  77mL OMNIPAQUE IOHEXOL 350 MG/ML SOLN  COMPARISON:  CT CHEST W/CM dated 12/02/2013  FINDINGS: Contrast within the pulmonary arterial tree is normal in appearance. There are no filling defects to suggest an acute pulmonary embolism. The caliber of the thoracic aorta is normal. There is no evidence of a false lumen. There is a moderate-sized right pleural effusion which has  increased slightly in volume since the previous study. There is no left pleural effusion. There is no pericardial effusion. The cardiac chambers are top-normal in size. No bulky mediastinal lymph nodes are present. There is soft tissue fullness in the right suprahilar region which is stable.  Within the upper abdomen the observed portions of the liver and spleen appear normal. There are no adrenal masses.  At lung window settings there is extensive pleural and parenchymal abnormality in the right upper lobe. This has not greatly changed since the previous study. There is also pleural-based density anteriorly and laterally in the right middle lobe. The left upper lobe demonstrates stable pleural-based increased density superiorly. Elsewhere within the left lung no significant parenchymal abnormality is demonstrated.  Within the upper abdomen the observed portions of the liver and spleen appear normal. The known L1 compression is only partially included in the field of view. No thoracic vertebral body fracture is demonstrated. The sternum appears intact. No acute rib lesion is demonstrated.  Review of the MIP images confirms the above findings.  IMPRESSION: 1. There is no evidence of an acute pulmonary embolism nor acute thoracic aortic pathology. 2. The moderate-sized right pleural effusion has increased in size since the previous study. 3. There is a stable appearance of the pleural and parenchymal scarring bilaterally but most conspicuously in the right upper lobe. Stable bronchiectatic  changes in the right upper lobe are demonstrated. 4. No bulky mediastinal or hilar lymph adenopathy is demonstrated.   Electronically Signed   By: David  Martinique   On: 01/27/2014 11:06   US Abdomen Complete  01/27/2014   CLINICAL DATA:  Abnormal gallbladder by CT scan.  EXAM: ULTRASOUND ABDOMEN COMPLETE  COMPARISON:  CT chest, abdomen and pelvis 01/27/2014 at 10:45 a.m.  FINDINGS: Gallbladder:  Gallbladder is markedly distended. A  2.5 x 1.1 x 1.4 cm stone is identified in the neck of the gallbladder with sludge present. Pericholecystic fluid is identified. Gallbladder wall thickness measures approximately 0.5 cm. Sonographer reports a positive Murphy's sign.  Common bile duct:  Diameter:  0.9 cm  Liver:  No focal lesion identified. Within normal limits in parenchymal echogenicity.  IVC:  No abnormality visualized.  Pancreas:  Visualized portion unremarkable.  Spleen:  Size and appearance within normal limits.  Right Kidney:  Length: 10.4 cm. No stone, mass or hydronephrosis is identified. Extra renal pelvis is noted.  Left Kidney:  Length: 10.6 cm.  Parapelvic cysts are noted.  Abdominal aorta:  No aneurysm visualized.  Other findings:  None.  IMPRESSION: Large gallstone appears to be impacted in the neck of the gallbladder. Findings compatible with acute cholecystitis are identified including gallbladder wall thickening, pericholecystic fluid and sonographic Murphy's sign.   Electronically Signed   By: Inge Rise M.D.   On: 01/27/2014 20:53   Ct Abdomen Pelvis W Contrast  01/27/2014   CLINICAL DATA:  History of throat and breast malignancy, chest pain and dyspnea  EXAM: CT ABDOMEN AND PELVIS WITH CONTRAST  TECHNIQUE: Multidetector CT imaging of the abdomen and pelvis was performed using the standard protocol following bolus administration of intravenous contrast.  CONTRAST:  35mL OMNIPAQUE IOHEXOL 350 MG/ML SOLN intravenously. The patient also received oral contrast material.  COMPARISON:  CT scan of the chest dated December 02, 2013.  FINDINGS: The liver exhibits no focal mass nor ductal dilation. The gallbladder is markedly distended today. It was not fully included in the field of view on the previous study and thus direct comparison is not possible. There is mild gallbladder wall thickening. No pericholecystic fluid is evident. The pancreas, spleen, and adrenal glands are normal in appearance. The enhancement blush of the renal  cortex is prominent bilaterally likely due to the early phase of the arterial injection for the accompanying PE study. There is a midpole hypodensity measuring approximately 7 x 10 mm in the right midpole most compatible with a cyst. There are extrarenal pelves bilaterally.  The caliber of the abdominal aorta is normal. The partially contrast-filled loops of small and large bowel exhibit no evidence of ileus nor of obstruction. No evidence of colitis or diverticulitis is demonstrated. A structure which likely reflects an air-filled normal calibered appendix is demonstrated in the right aspect of the pelvis. Urinary bladder is distended. The uterus is small appropriate for age. There is a cystic appearing left adnexal mass measuring approximately 4 cm in diameter and exhibiting a Hounsfield measurement of -29. There is no evidence of ascites. There is no inguinal or umbilical hernia.  The body of L1 exhibits moderate compression with loss of height anteriorly of 30%. There is mild retropulsion of the posterior superior cortex. There is diffusely increased density within the L1 vertebral body. The other vertebral bodies are preserved in height and demonstrate normal density. The bony pelvis exhibits no suspicious lytic or blastic lesions.  IMPRESSION: 1. There is abnormal distention of  the gallbladder with wall thickening. This may reflect acute cholecystitis. No stones are evident. Right upper quadrant ultrasound would be useful. 2. No acute abnormality of the liver or pancreas or spleen is demonstrated. 3. There is no evidence of acute bowel abnormality nor acute urinary tract abnormality. The urinary bladder is moderately distended and Foley catheterization may be useful if the patient cannot spontaneously void. 4. There is no evidence of ascites. No bulky intra-abdominal or pelvic lymphadenopathy is demonstrated. 5. There is stable wedge compression of the body of L1 with mild retropulsion of bone.    Electronically Signed   By: David  Martinique   On: 01/27/2014 11:18   Portable Chest 1 View  01/28/2014   CLINICAL DATA:  Pleural effusion.  Shortness of breath.  EXAM: PORTABLE CHEST - 1 VIEW  COMPARISON:  Chest CT from with 1 day prior.  FINDINGS: No cardiomegaly. Unchanged mediastinal contours, including architectural distortion on the right with rightward bronchial traction. Subpleural airspace opacities in the right mid and upper lung and left apex appears stable. Right upper bronchiectasis again noted. A small right pleural effusion is unchanged. No edema or pneumothorax.  IMPRESSION: Stable right-sided airspace disease and small effusion.   Electronically Signed   By: Jorje Guild M.D.   On: 01/28/2014 05:38    Anti-infectives: Anti-infectives   Start     Dose/Rate Route Frequency Ordered Stop   01/27/14 1500  Ampicillin-Sulbactam (UNASYN) 3 g in sodium chloride 0.9 % 100 mL IVPB     3 g 100 mL/hr over 60 Minutes Intravenous Every 6 hours 01/27/14 1459        Assessment/Plan: POD 1 lap chole for cholecystitis  Advance diet as tolerated Cont abx Hopefully home tomorrow if doing well pulm toilet, oob  Arrionna Serena 01/29/2014

## 2014-01-30 LAB — COMPREHENSIVE METABOLIC PANEL
ALBUMIN: 2.6 g/dL — AB (ref 3.5–5.2)
ALT: 51 U/L — ABNORMAL HIGH (ref 0–35)
AST: 34 U/L (ref 0–37)
Alkaline Phosphatase: 74 U/L (ref 39–117)
BUN: 14 mg/dL (ref 6–23)
CALCIUM: 8.2 mg/dL — AB (ref 8.4–10.5)
CHLORIDE: 108 meq/L (ref 96–112)
CO2: 24 mEq/L (ref 19–32)
Creatinine, Ser: 0.54 mg/dL (ref 0.50–1.10)
GFR calc Af Amer: >90 mL/min (ref >90)
GFR, EST NON AFRICAN AMERICAN: 87 mL/min — AB (ref >90)
Glucose, Bld: 111 mg/dL — ABNORMAL HIGH (ref 70–99)
Potassium: 3.8 mEq/L (ref 3.7–5.3)
SODIUM: 143 meq/L (ref 137–147)
Total Bilirubin: 0.5 mg/dL (ref 0.3–1.2)
Total Protein: 6.4 g/dL (ref 6.0–8.3)

## 2014-01-30 LAB — CBC
HCT: 33.3 % — ABNORMAL LOW (ref 36.0–46.0)
Hemoglobin: 11.2 g/dL — ABNORMAL LOW (ref 12.0–15.0)
MCH: 32.2 pg (ref 26.0–34.0)
MCHC: 33.6 g/dL (ref 30.0–36.0)
MCV: 95.7 fL (ref 78.0–100.0)
PLATELETS: 105 10*3/uL — AB (ref 150–400)
RBC: 3.48 MIL/uL — AB (ref 3.87–5.11)
RDW: 13.7 % (ref 11.5–15.5)
WBC: 6.2 10*3/uL (ref 4.0–10.5)

## 2014-01-30 MED ORDER — BISACODYL 10 MG RE SUPP
10.0000 mg | Freq: Once | RECTAL | Status: AC
  Administered 2014-01-30: 10 mg via RECTAL
  Filled 2014-01-30: qty 1

## 2014-01-30 MED ORDER — HYDROCODONE-ACETAMINOPHEN 5-325 MG PO TABS: 1.0000 | ORAL_TABLET | ORAL | Status: DC | PRN

## 2014-01-30 MED ORDER — CIPROFLOXACIN HCL 500 MG PO TABS: 500.0000 mg | ORAL_TABLET | Freq: Two times a day (BID) | ORAL | Status: DC

## 2014-01-30 MED ORDER — PANTOPRAZOLE SODIUM 40 MG PO TBEC: 40.0000 mg | DELAYED_RELEASE_TABLET | Freq: Every day | ORAL | Status: DC

## 2014-01-30 MED ORDER — CIPROFLOXACIN HCL 500 MG PO TABS
500.0000 mg | ORAL_TABLET | Freq: Two times a day (BID) | ORAL | Status: DC
  Administered 2014-01-30: 500 mg via ORAL
  Filled 2014-01-30 (×3): qty 1

## 2014-01-30 NOTE — Progress Notes (Signed)
Agree with above 

## 2014-01-30 NOTE — Discharge Summary (Signed)
Physician Discharge Summary  Veronica Pierce XKG:[id number] DOB: 02-15-1931 DOA: 01/27/2014  PCP: Arnette Norris, MD  Admit date: 01/27/2014 Discharge date: 01/30/2014  Time spent: > 35 minutes  Recommendations for Outpatient Follow-up:  1. Please be sure to follow up with your general surgeon in 1-2 weeks or as per their recommendations.  Discharge Diagnoses:  Principal Problem:   Cholecystitis Active Problems:   Hypothyroidism   HTN (hypertension)   Pleural effusion   History of throat cancer   Abdominal pain, acute, right lower quadrant   Nausea and vomiting in adult   History of breast cancer in female   Acute cholecystitis without calculus   Discharge Condition: stable  Diet recommendation: Heart healthy  Filed Weights   01/27/14 0817  Weight: 46.267 kg (102 lb)    History of present illness:  Pt is an 80 y/o with history of hypertension, hypothyroidism, h/o throat cancer s/p radiation/chemo, and BL breast cancer s/p BL mastectomies who presented to the ED complaining of Chest discomfort with nausea and emesis. U/S of abdomen reported large gallstones appearing to be impacted in the neck of the gallbladder and findings compatible with acute cholecystitis.   Hospital Course:  Principal Problem:  Cholecystitis  - Pt is s/p lap chole  - General surgery on board while patient in house and recommended the following: 1, will give dulcolax suppository today  2. Give her lunch. If she does ok with all of this she may dc home  3. Change unasyn to cipro. She will need 4 more days of this at home.  4. JP drain was pulled.  5. Follow up and instructions already done from surgical standpoint.  Pt initially complained of chest discomfort but had troponins which were negative x 3. Pain from cholecystitis of which she underwent cholecystectomy.  Active Problems:   Hypothyroidism  - stable, TSH WNL  -continue home Synthroid regimen   History of throat cancer/History of breast cancer in  female  - Patient to continue routine followup with oncologist upon discharge.   Nausea and vomiting in adult  - Resolved  -Secondary to primary problem list above    Procedures:  As indicated above.  Consultations:  General surgery: Dr. Donne Hazel  Discharge Exam: Filed Vitals:   01/30/14 1300  BP: 159/67  Pulse: 80  Temp: 97.3 F (36.3 C)  Resp: 16    General: Pt in NAD, Alert and Awake Cardiovascular: RRR, no MRG Respiratory: CTA BL, no wheezes  Discharge Instructions  Discharge Orders   Future Appointments Provider Department Dept Phone   02/24/2014 2:30 PM [facility] [place] [phone]   06/12/2014 9:30 AM [facility] [phone]   06/12/2014 10:00 AM Deatra Robinson, MD [facility] [phone]   Future Orders Complete By Expires   Call MD for:  persistant nausea and vomiting  As directed    Call MD for:  severe uncontrolled pain  As directed    Call MD for:  temperature >100.4  As directed    Diet - low sodium heart healthy  As directed    Discharge instructions  As directed    Comments:     Please be sure to follow up with your general surgeon as per their recommendations.   Increase activity slowly  As directed        Medication List    STOP taking these medications  oxyCODONE 5 MG/5ML solution  Commonly known as:  ROXICODONE      TAKE these medications       alendronate 35 MG tablet  Commonly known as:  FOSAMAX  Take 1 tablet (35 mg total) by mouth every 7 (seven) days. Take with a full glass of water on an empty stomach.     calcium gluconate 500 MG tablet  Take 1,000 mg by mouth daily.     ciprofloxacin 500 MG tablet  Commonly known as:  CIPRO  Take 1 tablet (500 mg total) by mouth 2 (two) times daily.     HYDROcodone-acetaminophen 5-325 MG per tablet  Commonly known as:  NORCO/VICODIN  Take 1-2 tablets by mouth every 4  (four) hours as needed for moderate pain.     letrozole 2.5 MG tablet  Commonly known as:  FEMARA  Take 1 tablet (2.5 mg total) by mouth daily.     levothyroxine 100 MCG tablet  Commonly known as:  SYNTHROID, LEVOTHROID  Take 100 mcg by mouth daily before breakfast.     MUCUS RELIEF 400 MG Tabs tablet  Generic drug:  guaifenesin  Take 400 mg by mouth daily as needed (congestion).     pilocarpine 5 MG tablet  Commonly known as:  SALAGEN  Take 5 mg by mouth every 12 (twelve) hours.       No Known Allergies     Follow-up Information   Follow up with [facility] On 02/24/2014. (2:30pm, arrive at 2:00pm for paperwork)    Contact information:   [address]   [place] [address] [phone]        The results of significant diagnostics from this hospitalization (including imaging, microbiology, ancillary and laboratory) are listed below for reference.    Significant Diagnostic Studies: Ct Angio Chest Pe W/cm &/or Wo Cm  01/27/2014   CLINICAL DATA:  Chest pain with history of throat and breast malignancy  EXAM: CT ANGIOGRAPHY CHEST WITH CONTRAST  TECHNIQUE: Multidetector CT imaging of the chest was performed using the standard protocol during bolus administration of intravenous contrast. Multiplanar CT image reconstructions including MIPs were obtained to evaluate the vascular anatomy.  CONTRAST:  59mL OMNIPAQUE IOHEXOL 350 MG/ML SOLN  COMPARISON:  CT CHEST W/CM dated 12/02/2013  FINDINGS: Contrast within the pulmonary arterial tree is normal in appearance. There are no filling defects to suggest an acute pulmonary embolism. The caliber of the thoracic aorta is normal. There is no evidence of a false lumen. There is a moderate-sized right pleural effusion which has increased slightly in volume since the previous study. There is no left pleural effusion. There is no pericardial effusion. The cardiac chambers are top-normal in size. No bulky mediastinal lymph  nodes are present. There is soft tissue fullness in the right suprahilar region which is stable.  Within the upper abdomen the observed portions of the liver and spleen appear normal. There are no adrenal masses.  At lung window settings there is extensive pleural and parenchymal abnormality in the right upper lobe. This has not greatly changed since the previous study. There is also pleural-based density anteriorly and laterally in the right middle lobe. The left upper lobe demonstrates stable pleural-based increased density superiorly. Elsewhere within the left lung no significant parenchymal abnormality is demonstrated.  Within the upper abdomen the observed portions of the liver and spleen appear normal. The known L1 compression is only partially included in the field of view. No thoracic  vertebral body fracture is demonstrated. The sternum appears intact. No acute rib lesion is demonstrated.  Review of the MIP images confirms the above findings.  IMPRESSION: 1. There is no evidence of an acute pulmonary embolism nor acute thoracic aortic pathology. 2. The moderate-sized right pleural effusion has increased in size since the previous study. 3. There is a stable appearance of the pleural and parenchymal scarring bilaterally but most conspicuously in the right upper lobe. Stable bronchiectatic changes in the right upper lobe are demonstrated. 4. No bulky mediastinal or hilar lymph adenopathy is demonstrated.   Electronically Signed   By: David  Martinique   On: 01/27/2014 11:06   US Abdomen Complete  01/27/2014   CLINICAL DATA:  Abnormal gallbladder by CT scan.  EXAM: ULTRASOUND ABDOMEN COMPLETE  COMPARISON:  CT chest, abdomen and pelvis 01/27/2014 at 10:45 a.m.  FINDINGS: Gallbladder:  Gallbladder is markedly distended. A 2.5 x 1.1 x 1.4 cm stone is identified in the neck of the gallbladder with sludge present. Pericholecystic fluid is identified. Gallbladder wall thickness measures approximately 0.5 cm.  Sonographer reports a positive Murphy's sign.  Common bile duct:  Diameter:  0.9 cm  Liver:  No focal lesion identified. Within normal limits in parenchymal echogenicity.  IVC:  No abnormality visualized.  Pancreas:  Visualized portion unremarkable.  Spleen:  Size and appearance within normal limits.  Right Kidney:  Length: 10.4 cm. No stone, mass or hydronephrosis is identified. Extra renal pelvis is noted.  Left Kidney:  Length: 10.6 cm.  Parapelvic cysts are noted.  Abdominal aorta:  No aneurysm visualized.  Other findings:  None.  IMPRESSION: Large gallstone appears to be impacted in the neck of the gallbladder. Findings compatible with acute cholecystitis are identified including gallbladder wall thickening, pericholecystic fluid and sonographic Murphy's sign.   Electronically Signed   By: Inge Rise M.D.   On: 01/27/2014 20:53   Ct Abdomen Pelvis W Contrast  01/27/2014   CLINICAL DATA:  History of throat and breast malignancy, chest pain and dyspnea  EXAM: CT ABDOMEN AND PELVIS WITH CONTRAST  TECHNIQUE: Multidetector CT imaging of the abdomen and pelvis was performed using the standard protocol following bolus administration of intravenous contrast.  CONTRAST:  23mL OMNIPAQUE IOHEXOL 350 MG/ML SOLN intravenously. The patient also received oral contrast material.  COMPARISON:  CT scan of the chest dated December 02, 2013.  FINDINGS: The liver exhibits no focal mass nor ductal dilation. The gallbladder is markedly distended today. It was not fully included in the field of view on the previous study and thus direct comparison is not possible. There is mild gallbladder wall thickening. No pericholecystic fluid is evident. The pancreas, spleen, and adrenal glands are normal in appearance. The enhancement blush of the renal cortex is prominent bilaterally likely due to the early phase of the arterial injection for the accompanying PE study. There is a midpole hypodensity measuring approximately 7 x 10 mm in  the right midpole most compatible with a cyst. There are extrarenal pelves bilaterally.  The caliber of the abdominal aorta is normal. The partially contrast-filled loops of small and large bowel exhibit no evidence of ileus nor of obstruction. No evidence of colitis or diverticulitis is demonstrated. A structure which likely reflects an air-filled normal calibered appendix is demonstrated in the right aspect of the pelvis. Urinary bladder is distended. The uterus is small appropriate for age. There is a cystic appearing left adnexal mass measuring approximately 4 cm in diameter and exhibiting a Hounsfield measurement  of -29. There is no evidence of ascites. There is no inguinal or umbilical hernia.  The body of L1 exhibits moderate compression with loss of height anteriorly of 30%. There is mild retropulsion of the posterior superior cortex. There is diffusely increased density within the L1 vertebral body. The other vertebral bodies are preserved in height and demonstrate normal density. The bony pelvis exhibits no suspicious lytic or blastic lesions.  IMPRESSION: 1. There is abnormal distention of the gallbladder with wall thickening. This may reflect acute cholecystitis. No stones are evident. Right upper quadrant ultrasound would be useful. 2. No acute abnormality of the liver or pancreas or spleen is demonstrated. 3. There is no evidence of acute bowel abnormality nor acute urinary tract abnormality. The urinary bladder is moderately distended and Foley catheterization may be useful if the patient cannot spontaneously void. 4. There is no evidence of ascites. No bulky intra-abdominal or pelvic lymphadenopathy is demonstrated. 5. There is stable wedge compression of the body of L1 with mild retropulsion of bone.   Electronically Signed   By: David  Martinique   On: 01/27/2014 11:18   Portable Chest 1 View  01/28/2014   CLINICAL DATA:  Pleural effusion.  Shortness of breath.  EXAM: PORTABLE CHEST - 1 VIEW   COMPARISON:  Chest CT from with 1 day prior.  FINDINGS: No cardiomegaly. Unchanged mediastinal contours, including architectural distortion on the right with rightward bronchial traction. Subpleural airspace opacities in the right mid and upper lung and left apex appears stable. Right upper bronchiectasis again noted. A small right pleural effusion is unchanged. No edema or pneumothorax.  IMPRESSION: Stable right-sided airspace disease and small effusion.   Electronically Signed   By: Jorje Guild M.D.   On: 01/28/2014 05:38    Microbiology: Recent Results (from the past 240 hour(s))  URINE CULTURE     Status: None   Collection Time    01/27/14 10:20 AM      Result Value Range Status   Specimen Description URINE, RANDOM   Final   Special Requests NONE   Final   Culture  Setup Time     Final   Value: 01/27/2014 14:03     Performed at [facility]     Final   Value: 9,000 COLONIES/ML     Performed at [facility]   Culture     Final   Value: INSIGNIFICANT GROWTH     Performed at [facility]   Report Status 01/28/2014 FINAL   Final  MRSA PCR SCREENING     Status: None   Collection Time    01/28/14  9:22 AM      Result Value Range Status   MRSA by PCR NEGATIVE  NEGATIVE Final   Comment:            The GeneXpert MRSA Assay (FDA     approved for NASAL specimens     only), is one component of a     comprehensive MRSA colonization     surveillance program. It is not     intended to diagnose MRSA     infection nor to guide or     monitor treatment for     MRSA infections.     Labs: Basic Metabolic Panel:  Recent Labs Lab 01/27/14 0840 01/27/14 1409 01/28/14 0300 01/30/14 0724  NA 140  --  141 143  K 4.4  --  3.9 3.8  CL 97  --  103 108  CO2 26  --  24 24  GLUCOSE 186*  --  142* 111*  BUN 20  --  15 14  CREATININE 0.69 0.65 0.62 0.54  CALCIUM 9.9  --  8.7 8.2*   Liver Function Tests:  Recent Labs Lab 01/27/14 0840  01/28/14 0300 01/30/14 0724  AST 22 48* 34  ALT 14 36* 51*  ALKPHOS 66 68 74  BILITOT 0.4 0.9 0.5  PROT 8.0 7.0 6.4  ALBUMIN 4.0 3.3* 2.6*    Recent Labs Lab 01/27/14 0840  LIPASE 46   No results found for this basename: AMMONIA,  in the last 168 hours CBC:  Recent Labs Lab 01/27/14 0840 01/27/14 1409 01/28/14 0300 01/30/14 0448  WBC 10.1 13.8* 13.0* 6.2  NEUTROABS 9.5*  --   --   --   HGB 14.1 14.3 13.5 11.2*  HCT 40.6 41.2 39.6 33.3*  MCV 93.3 93.6 94.3 95.7  PLT 155 160 131* 105*   Cardiac Enzymes:  Recent Labs Lab 01/27/14 0840 01/27/14 1455 01/27/14 2105  TROPONINI <0.30 <0.30 <0.30   BNP: BNP (last 3 results) No results found for this basename: PROBNP,  in the last 8760 hours CBG: No results found for this basename: GLUCAP,  in the last 168 hours     Signed:  Velvet Bathe  [facility] 01/30/2014, 3:04 PM

## 2014-01-30 NOTE — Discharge Instructions (Signed)
[facility] ______[facility] °LAPAROSCOPIC SURGERY: POST OP INSTRUCTIONS °Always review your discharge instruction sheet given to you by the facility where your surgery was performed. °IF YOU HAVE DISABILITY OR FAMILY LEAVE FORMS, YOU MUST BRING THEM TO THE OFFICE FOR PROCESSING.   °DO NOT GIVE THEM TO YOUR DOCTOR. ° °1. A prescription for pain medication may be given to you upon discharge.  Take your pain medication as prescribed, if needed.  If narcotic pain medicine is not needed, then you may take acetaminophen (Tylenol) or ibuprofen (Advil) as needed. °2. Take your usually prescribed medications unless otherwise directed. °3. If you need a refill on your pain medication, please contact your pharmacy.  They will contact our office to request authorization. Prescriptions will not be filled after 5pm or on week-ends. °4. You should follow a light diet the first few days after arrival home, such as soup and crackers, etc.  Be sure to include lots of fluids daily. °5. Most patients will experience some swelling and bruising in the area of the incisions.  Ice packs will help.  Swelling and bruising can take several days to resolve.  °6. It is common to experience some constipation if taking pain medication after surgery.  Increasing fluid intake and taking a stool softener (such as Colace) will usually help or prevent this problem from occurring.  A mild laxative (Milk of Magnesia or Miralax) should be taken according to package instructions if there are no bowel movements after 48 hours. °7. Unless discharge instructions indicate otherwise, you may remove your bandages 24-48 hours after surgery, and you may shower at that time.  You may have steri-strips (small skin tapes) in place directly over the incision.  These strips should be left on the skin for 7-10 days.  If your surgeon used skin glue on the incision, you may shower in 24 hours.  The glue will flake off over the next 2-3 weeks.  Any sutures or  staples will be removed at the office during your follow-up visit. °8. ACTIVITIES:  You may resume regular (light) daily activities beginning the next day--such as daily self-care, walking, climbing stairs--gradually increasing activities as tolerated.  You may have sexual intercourse when it is comfortable.  Refrain from any heavy lifting or straining until approved by your doctor. °a. You may drive when you are no longer taking prescription pain medication, you can comfortably wear a seatbelt, and you can safely maneuver your car and apply brakes. °b. RETURN TO WORK:  __________________________________________________________ °9. You should see your doctor in the office for a follow-up appointment approximately 2-3 weeks after your surgery.  Make sure that you call for this appointment within a day or two after you arrive home to insure a convenient appointment time. °10. OTHER INSTRUCTIONS: __________________________________________________________________________________________________________________________ __________________________________________________________________________________________________________________________ °WHEN TO CALL YOUR DOCTOR: °1. Fever over 101.0 °2. Inability to urinate °3. Continued bleeding from incision. °4. Increased pain, redness, or drainage from the incision. °5. Increasing abdominal pain ° °The clinic staff is available to answer your questions during regular business hours.  Please don’t hesitate to call and ask to speak to one of the nurses for clinical concerns.  If you have a medical emergency, go to the nearest emergency room or call 911.  A surgeon from [facility] is always on call at the hospital. °[address], [address], [place], [place]  [address] ? [address] [address], [place], [place]   [address] °[phone] ? [phone] ? FAX [phone] °Web site:   [email or web address] °

## 2014-01-30 NOTE — Progress Notes (Signed)
Patient ID: Veronica Pierce, female   DOB: 03/30/1931, 80 y.o.   MRN: [id number] 2 Days Post-Op  Subjective: Pt feels well.  Eager to go home.  Tolerating a regular diet with no nausea.  No BM, no flatus just yet.  Objective: Vital signs in last 24 hours: Temp:  [97.7 F (36.5 C)-98.7 F (37.1 C)] 97.8 F (36.6 C) (02/06 0452) Pulse Rate:  [73-76] 76 (02/06 0452) Resp:  [16-17] 17 (02/06 0452) BP: (134-168)/(45-71) 168/71 mmHg (02/06 0452) SpO2:  [96 %-98 %] 96 % (02/06 0452) Last BM Date: 01/27/14  Intake/Output from previous day: 02/05 0701 - 02/06 0700 In: 1360 [P.O.:360; I.V.:1000] Out: 320 [Urine:300; Drains:20] Intake/Output this shift: Total I/O In: -  Out: 20 [Drains:20]  PE: Abd: soft, incisions c/d/i, JP drain with just serous output.  This was pulled without any issues. Dry gauze placed over site. +BS  Lab Results:   Recent Labs  01/28/14 0300 01/30/14 0448  WBC 13.0* 6.2  HGB 13.5 11.2*  HCT 39.6 33.3*  PLT 131* 105*   BMET  Recent Labs  01/28/14 0300 01/30/14 0724  NA 141 143  K 3.9 3.8  CL 103 108  CO2 24 24  GLUCOSE 142* 111*  BUN 15 14  CREATININE 0.62 0.54  CALCIUM 8.7 8.2*   PT/INR  Recent Labs  01/28/14 0300  LABPROT 14.9  INR 1.20   CMP     Component Value Date/Time   NA 143 01/30/2014 0724   NA 141 11/28/2013 0959   K 3.8 01/30/2014 0724   K 4.4 11/28/2013 0959   CL 108 01/30/2014 0724   CO2 24 01/30/2014 0724   CO2 28 11/28/2013 0959   GLUCOSE 111* 01/30/2014 0724   GLUCOSE 139 11/28/2013 0959   BUN 14 01/30/2014 0724   BUN 19.7 11/28/2013 0959   CREATININE 0.54 01/30/2014 0724   CREATININE 0.8 11/28/2013 0959   CALCIUM 8.2* 01/30/2014 0724   CALCIUM 9.9 11/28/2013 0959   PROT 6.4 01/30/2014 0724   PROT 7.3 11/28/2013 0959   ALBUMIN 2.6* 01/30/2014 0724   ALBUMIN 3.7 11/28/2013 0959   AST 34 01/30/2014 0724   AST 21 11/28/2013 0959   ALT 51* 01/30/2014 0724   ALT 12 11/28/2013 0959   ALKPHOS 74 01/30/2014 0724   ALKPHOS 60 11/28/2013 0959   BILITOT  0.5 01/30/2014 0724   BILITOT 0.48 11/28/2013 0959   GFRNONAA 87* 01/30/2014 0724   GFRAA >90 01/30/2014 0724   Lipase     Component Value Date/Time   LIPASE 46 01/27/2014 0840       Studies/Results: No results found.  Anti-infectives: Anti-infectives   Start     Dose/Rate Route Frequency Ordered Stop   01/30/14 0930  ciprofloxacin (CIPRO) tablet 500 mg     500 mg Oral 2 times daily 01/30/14 0916     01/27/14 1500  Ampicillin-Sulbactam (UNASYN) 3 g in sodium chloride 0.9 % 100 mL IVPB  Status:  Discontinued     3 g 100 mL/hr over 60 Minutes Intravenous Every 6 hours 01/27/14 1459 01/30/14 0916       Assessment/Plan  1. POD 2, s/p lap chole  Plan: 1, will give dulcolax suppository today 2. Give her lunch.  If she does ok with all of this she may dc home 3. Change unasyn to cipro.  She will need 4 more days of this at home. 4. JP drain was pulled. 5. Follow up and instructions already done from surgical standpoint.  LOS: 3 days    Veronica Pierce E 01/30/2014, 9:17 AM Pager: [phone]

## 2014-02-02 ENCOUNTER — Encounter (HOSPITAL_COMMUNITY): Payer: Self-pay | Admitting: General Surgery

## 2014-02-07 ENCOUNTER — Other Ambulatory Visit: Payer: Self-pay | Admitting: *Deleted

## 2014-02-09 ENCOUNTER — Telehealth (INDEPENDENT_AMBULATORY_CARE_PROVIDER_SITE_OTHER): Payer: Self-pay | Admitting: *Deleted

## 2014-02-09 NOTE — Telephone Encounter (Signed)
Patient's daughter called to report that since patient finished antibiotic she has had redness and tenderness on the inside of her mouth and tongue.  I explained that I'm not sure about this since typically the side effect from an antibiotic in the mouth is thrush however that would be a white matter inside the mouth.  Daughter states it is definitely red and tender.  I explained that I would send a message to Dr. Donne Hazel to make him aware and to see if he has any suggestions then we will let her know.  Explained that Dr. Donne Hazel is not available today however as soon as we hear back from him we will call the patient.  Daughter states understanding and agreeable at this time.

## 2014-02-10 NOTE — Telephone Encounter (Signed)
Called pt back after speaking with Dr Donne Hazel her advised for pt to contact her medical doctor. The pt stated that her mouth is actually is better today.

## 2014-02-18 ENCOUNTER — Ambulatory Visit (INDEPENDENT_AMBULATORY_CARE_PROVIDER_SITE_OTHER): Payer: Medicare Other | Admitting: Family Medicine

## 2014-02-18 ENCOUNTER — Encounter: Payer: Self-pay | Admitting: Family Medicine

## 2014-02-18 VITALS — BP 148/78 | HR 87 | Temp 97.4°F | Wt 102.0 lb

## 2014-02-18 DIAGNOSIS — N9089 Other specified noninflammatory disorders of vulva and perineum: Secondary | ICD-10-CM | POA: Insufficient documentation

## 2014-02-18 DIAGNOSIS — B370 Candidal stomatitis: Secondary | ICD-10-CM | POA: Insufficient documentation

## 2014-02-18 MED ORDER — NYSTATIN 100000 UNIT/ML MT SUSP: 5.0000 mL | Freq: Four times a day (QID) | OROMUCOSAL | Status: DC

## 2014-02-18 NOTE — Assessment & Plan Note (Signed)
Likely secondary to abx. Will treat with nystatin rinse. Call or return to clinic prn if these symptoms worsen or fail to improve as anticipated. The patient indicates understanding of these issues and agrees with the plan.

## 2014-02-18 NOTE — Progress Notes (Signed)
Pre visit review using our clinic review tool, if applicable. No additional management support is needed unless otherwise documented below in the visit note. 

## 2014-02-18 NOTE — Patient Instructions (Signed)
Great to see you. Please use mouthwash as directed until symptoms resolve. Please call me if no improvement in a week. We will call you with an appointment to see a gynecologist.

## 2014-02-18 NOTE — Progress Notes (Signed)
Subjective:   Patient ID: Veronica Pierce, female    DOB: 1931-11-06, 80 y.o.   MRN: [id number]  Veronica Pierce is a pleasant 80 y.o. year old female who presents with her husband and daughter to clinic today with Rash  on 02/18/2014  HPI: Rash- tongue has been very red and tender since she was discharged from hospital s/p cholecystectomy.  She was given multiple abx before and after procedure.  No pain or difficulty swallowing.  ?bladder dropping- this morning while wiping herself after urinating, she felt something sticking out in her vaginal area.  Non tender but since she felt it, she has noticed something feels different when she sits down.  No bleeding.  No abdominal pain. She has not had a hysterectomy.  Patient Active Problem List   Diagnosis Date Noted  . Lesion of vulva 02/18/2014  . Oral thrush 02/18/2014  . Cholecystitis 01/27/2014  . Pleural effusion 01/27/2014  . History of throat cancer 01/27/2014  . Abdominal pain, acute, right lower quadrant 01/27/2014  . Nausea and vomiting in adult 01/27/2014  . History of breast cancer in female 01/27/2014  . Acute cholecystitis without calculus 01/27/2014  . Throat cancer   . Breast cancer   . Hypothyroidism   . HTN (hypertension)    Past Medical History  Diagnosis Date  . Throat cancer 2010  . Breast cancer 2010    left   . Lung cancer 2012  . Breast cancer 2013    right  . Hypothyroidism   . HTN (hypertension)    Past Surgical History  Procedure Laterality Date  . Mastectomy Bilateral     2010, 2013  . Mastectomy  bilateral  . Back surgery    . Portacath placement    . Port-a-cath removal    . Cholecystectomy N/A 01/28/2014    Procedure: LAPAROSCOPIC CHOLECYSTECTOMY;  Surgeon: Rolm Bookbinder, MD;  Location: [facility] OR;  Service: General;  Laterality: N/A;   History  Substance Use Topics  . Smoking status: Never Smoker   . Smokeless tobacco: Never Used  . Alcohol Use: No   Family History  Problem Relation Age of  Onset  . Diabetes Mother   . Arthritis Father    No Known Allergies Current Outpatient Prescriptions on File Prior to Visit  Medication Sig Dispense Refill  . alendronate (FOSAMAX) 35 MG tablet Take 1 tablet (35 mg total) by mouth every 7 (seven) days. Take with a full glass of water on an empty stomach.  12 tablet  6  . calcium gluconate 500 MG tablet Take 1,000 mg by mouth daily.      Marland Kitchen guaifenesin (MUCUS RELIEF) 400 MG TABS Take 400 mg by mouth daily as needed (congestion).       Marland Kitchen HYDROcodone-acetaminophen (NORCO/VICODIN) 5-325 MG per tablet Take 1-2 tablets by mouth every 4 (four) hours as needed for moderate pain.  30 tablet  0  . letrozole (FEMARA) 2.5 MG tablet Take 1 tablet (2.5 mg total) by mouth daily.  90 tablet  4  . levothyroxine (SYNTHROID, LEVOTHROID) 100 MCG tablet Take 100 mcg by mouth daily before breakfast.      . pilocarpine (SALAGEN) 5 MG tablet Take 5 mg by mouth every 12 (twelve) hours.       No current facility-administered medications on file prior to visit.   The PMH, PSH, Social History, Family History, Medications, and allergies have been reviewed in [facility], and have been updated if relevant.   Review of Systems See HPI  No fevers No n/v/d    Objective:    BP 148/78  Pulse 87  Temp(Src) 97.4 F (36.3 C) (Oral)  Wt 102 lb (46.267 kg)  SpO2 96%   Physical Exam  Nursing note and vitals reviewed. Constitutional: She appears well-developed and well-nourished. No distress.  HENT:  Head: Normocephalic.  Mouth/Throat:    Genitourinary: Rectum normal.     Psychiatric: She has a normal mood and affect. Her speech is normal and behavior is normal. Judgment and thought content normal. Cognition and memory are normal.          Assessment & Plan:   Lesion of vulva - Plan: Ambulatory referral to Gynecology  Oral thrush No Follow-up on file.

## 2014-02-18 NOTE — Assessment & Plan Note (Signed)
Did not see any indication of prolapse of uterus or bladder- there is a lesion.  Will refer to GYN for biopsy. The patient indicates understanding of these issues and agrees with the plan.

## 2014-02-24 ENCOUNTER — Ambulatory Visit (INDEPENDENT_AMBULATORY_CARE_PROVIDER_SITE_OTHER): Payer: Medicare Other | Admitting: General Surgery

## 2014-02-24 ENCOUNTER — Encounter: Payer: Self-pay | Admitting: Obstetrics & Gynecology

## 2014-02-24 ENCOUNTER — Ambulatory Visit (INDEPENDENT_AMBULATORY_CARE_PROVIDER_SITE_OTHER): Payer: Medicare Other | Admitting: Obstetrics & Gynecology

## 2014-02-24 ENCOUNTER — Encounter (INDEPENDENT_AMBULATORY_CARE_PROVIDER_SITE_OTHER): Payer: Self-pay | Admitting: General Surgery

## 2014-02-24 ENCOUNTER — Other Ambulatory Visit: Payer: Self-pay | Admitting: Family Medicine

## 2014-02-24 VITALS — BP 146/82 | HR 78 | Temp 98.2°F | Resp 20 | Ht 60.0 in | Wt 103.0 lb

## 2014-02-24 VITALS — BP 161/74 | HR 97 | Ht 65.0 in | Wt 101.0 lb

## 2014-02-24 DIAGNOSIS — K810 Acute cholecystitis: Secondary | ICD-10-CM

## 2014-02-24 DIAGNOSIS — L900 Lichen sclerosus et atrophicus: Secondary | ICD-10-CM

## 2014-02-24 DIAGNOSIS — L940 Localized scleroderma [morphea]: Secondary | ICD-10-CM

## 2014-02-24 DIAGNOSIS — N9089 Other specified noninflammatory disorders of vulva and perineum: Secondary | ICD-10-CM

## 2014-02-24 MED ORDER — CLOBETASOL PROPIONATE 0.05 % EX OINT: TOPICAL_OINTMENT | CUTANEOUS | Status: DC

## 2014-02-24 NOTE — Patient Instructions (Signed)
Continue nystatin as directed if not better call your PCP next week. Call us if you have any problems.

## 2014-02-24 NOTE — Progress Notes (Signed)
Veronica Pierce 05/31/1931 [id number] 02/24/2014   Veronica Pierce is a 80 y.o. female who had a laparoscopic cholecystectomy with intraoperative cholangiogram by Dr. Rolm Bookbinder, 01/28/2014 . Complains of red sore tongue and throat, she is on oral nystatin. .  The pathology report confirmed:  Gallbladder - CHRONIC ACTIVE CHOLECYSTITIS. - CHOLELITHIASIS.Marland Kitchen   The patient reports that they are feeling well with normal bowel movements and good appetite.  The pre-operative symptoms of abdominal pain, nausea, and vomiting have resolved.    Physical examination - Incisions appear well-healed with no sign of infection or bleeding.   Abdomen - soft, non-tender Tongue still a little red and rough.  No white areas noted. BP 146/82  Pulse 78  Temp(Src) 98.2 F (36.8 C) (Oral)  Resp 20  Ht 5' (1.524 m)  Wt 46.72 kg (103 lb)  BMI 20.12 kg/m2  Prior to Admission medications   Medication Sig Start Date End Date Taking? Authorizing Provider  alendronate (FOSAMAX) 35 MG tablet Take 1 tablet (35 mg total) by mouth every 7 (seven) days. Take with a full glass of water on an empty stomach. 08/22/13   Deatra Robinson, MD  calcium gluconate 500 MG tablet Take 1,000 mg by mouth daily.    Historical Provider, MD  guaifenesin (MUCUS RELIEF) 400 MG TABS Take 400 mg by mouth daily as needed (congestion).     Historical Provider, MD  HYDROcodone-acetaminophen (NORCO/VICODIN) 5-325 MG per tablet Take 1-2 tablets by mouth every 4 (four) hours as needed for moderate pain. 01/30/14   Velvet Bathe, MD  letrozole [facility]) 2.5 MG tablet Take 1 tablet (2.5 mg total) by mouth daily. 10/16/13   Deatra Robinson, MD  levothyroxine (SYNTHROID, LEVOTHROID) 100 MCG tablet Take 100 mcg by mouth daily before breakfast.    Historical Provider, MD  nystatin (MYCOSTATIN) 100000 UNIT/ML suspension Take 5 mLs (500,000 Units total) by mouth 4 (four) times daily. 02/18/14   Lucille Passy, MD  pilocarpine (SALAGEN) 5 MG tablet Take 5 mg by mouth  every 12 (twelve) hours.    Historical Provider, MD    Impression:  s/p laparoscopic cholecystectomy History of throat cancer Hypertension Pleural effusion Hypothyroid Plan:  She may resume a regular diet and full activity.  She may follow-up on a PRN basis.

## 2014-02-24 NOTE — Patient Instructions (Addendum)
Lichen sclerosus

## 2014-02-24 NOTE — Progress Notes (Signed)
   Subjective:    Patient ID: Veronica Pierce, female    DOB: [birth year], 80 y.o.   MRN: [id number]  HPI  This lovely 64 cancer survivor is here today for a biopsy of a vulva lesion discovered by Dr. Deborra Medina recently. She reports vulvar burning.  Review of Systems     Objective:   Physical Exam  I see what appears to be lichen sclerosus, involving the introitus as well as the periclitoral area. I biopsied with a 3 mm punch the area at the crux of the left labia minora. I numbed the area with 1% lidocaine and prepped with betadine. She tolerated the procedure well. Silver nitrate yielded hemostasis.      Assessment & Plan:  Probable lichen sclerosus- I will prescribe clobetasol and recheck her vulva in 6 months. I did discuss the increased incidence of vulvar cancer with this disorder.

## 2014-02-26 NOTE — Telephone Encounter (Signed)
Pt requesting medication refill. Last ov 0/2015 with no future appts scheduled. Pls advise

## 2014-03-12 ENCOUNTER — Telehealth: Payer: Self-pay | Admitting: Family Medicine

## 2014-03-12 NOTE — Telephone Encounter (Signed)
Spoke to Dr. Erik Obey- U98 is low and he would like [place] to treat this.  Very appropriate request.  Please call their office to give them our direct fax line to send results to [place] (sorry I could not remember fax number).  Thanks

## 2014-03-12 NOTE — Telephone Encounter (Signed)
Spoke to Amy at Dr [facility] office states that she will send over pts lab results as requested.

## 2014-03-12 NOTE — Telephone Encounter (Signed)
Dr. Erik Obey from [facility] called in regards to Ms. Veronica Pierce. When you have a chance he would like you to call him at [phone]. Thanks.

## 2014-03-13 ENCOUNTER — Telehealth: Payer: Self-pay | Admitting: Family Medicine

## 2014-03-13 NOTE — Telephone Encounter (Signed)
error 

## 2014-03-13 NOTE — Telephone Encounter (Signed)
Pt scheduled to come in on Tuesday 03/17/14.

## 2014-03-13 NOTE — Telephone Encounter (Signed)
Labs received and placed in Dr Hulen Shouts inbox for review

## 2014-03-17 ENCOUNTER — Ambulatory Visit: Payer: Medicare Other | Admitting: Family Medicine

## 2014-03-24 ENCOUNTER — Ambulatory Visit (INDEPENDENT_AMBULATORY_CARE_PROVIDER_SITE_OTHER): Payer: Medicare Other | Admitting: Family Medicine

## 2014-03-24 ENCOUNTER — Encounter: Payer: Self-pay | Admitting: Family Medicine

## 2014-03-24 VITALS — BP 140/84 | HR 84 | Temp 97.6°F | Wt 102.5 lb

## 2014-03-24 DIAGNOSIS — E538 Deficiency of other specified B group vitamins: Secondary | ICD-10-CM

## 2014-03-24 MED ORDER — CYANOCOBALAMIN 1000 MCG/ML IJ SOLN
1000.0000 ug | Freq: Once | INTRAMUSCULAR | Status: AC
  Administered 2014-03-24: 1000 ug via INTRAMUSCULAR

## 2014-03-24 NOTE — Progress Notes (Signed)
Pre visit review using our clinic review tool, if applicable. No additional management support is needed unless otherwise documented below in the visit note. 

## 2014-03-24 NOTE — Progress Notes (Signed)
Subjective:   Patient ID: Veronica Pierce, female    DOB: 08-20-1931, 80 y.o.   MRN: [id number]  Veronica Pierce is a pleasant 80 y.o. year old female who presents to clinic today with discuss b12  on 03/24/2014  HPI: H/o tongue CA.  Has been having red, painful tongue.  Saw ENT, Dr. Daria Pastures.  He called me to let me know that her B12 was low (sending results) and asked me to replete this.  Patient Active Problem List   Diagnosis Date Noted  . B12 deficiency 03/24/2014  . Lesion of vulva 02/18/2014  . Oral thrush 02/18/2014  . Cholecystitis 01/27/2014  . Pleural effusion 01/27/2014  . History of throat cancer 01/27/2014  . Abdominal pain, acute, right lower quadrant 01/27/2014  . Nausea and vomiting in adult 01/27/2014  . History of breast cancer in female 01/27/2014  . Acute cholecystitis without calculus 01/27/2014  . Throat cancer   . Breast cancer   . Hypothyroidism   . HTN (hypertension)    Past Medical History  Diagnosis Date  . Throat cancer 2010  . Breast cancer 2010    left   . Lung cancer 2012  . Breast cancer 2013    right  . Hypothyroidism   . HTN (hypertension)    Past Surgical History  Procedure Laterality Date  . Mastectomy Bilateral     2010, 2013  . Mastectomy  bilateral  . Back surgery    . Portacath placement    . Port-a-cath removal    . Cholecystectomy N/A 01/28/2014    Procedure: LAPAROSCOPIC CHOLECYSTECTOMY;  Surgeon: Rolm Bookbinder, MD;  Location: [facility] OR;  Service: General;  Laterality: N/A;   History  Substance Use Topics  . Smoking status: Never Smoker   . Smokeless tobacco: Never Used  . Alcohol Use: No   Family History  Problem Relation Age of Onset  . Diabetes Mother   . Arthritis Father    No Known Allergies Current Outpatient Prescriptions on File Prior to Visit  Medication Sig Dispense Refill  . alendronate (FOSAMAX) 35 MG tablet Take 1 tablet (35 mg total) by mouth every 7 (seven) days. Take with a full glass of water on an empty  stomach.  12 tablet  6  . calcium gluconate 500 MG tablet Take 1,000 mg by mouth daily.      Marland Kitchen guaifenesin (MUCUS RELIEF) 400 MG TABS Take 400 mg by mouth daily as needed (congestion).       Marland Kitchen HYDROcodone-acetaminophen (NORCO/VICODIN) 5-325 MG per tablet Take 1-2 tablets by mouth every 4 (four) hours as needed for moderate pain.  30 tablet  0  . letrozole (FEMARA) 2.5 MG tablet Take 1 tablet (2.5 mg total) by mouth daily.  90 tablet  4  . levothyroxine (SYNTHROID, LEVOTHROID) 100 MCG tablet Take 100 mcg by mouth daily before breakfast.      . nystatin (MYCOSTATIN) 100000 UNIT/ML suspension TAKE 5 MLS BY MOUTH 4 TIMES DAILY.  60 mL  0  . pilocarpine (SALAGEN) 5 MG tablet Take 5 mg by mouth every 12 (twelve) hours.       No current facility-administered medications on file prior to visit.   The PMH, PSH, Social History, Family History, Medications, and allergies have been reviewed in [facility], and have been updated if relevant.   Review of Systems    + fatigue Objective:    BP 140/84  Pulse 84  Temp(Src) 97.6 F (36.4 C) (Oral)  Wt 102 lb 8 oz (  46.494 kg)  SpO2 97%   Physical Exam  Nursing note and vitals reviewed. Constitutional: She appears well-developed and well-nourished. No distress.  Skin: Skin is warm and dry.  Psychiatric: She has a normal mood and affect. Her behavior is normal. Judgment and thought content normal.          Assessment & Plan:   No diagnosis found. No Follow-up on file.

## 2014-03-24 NOTE — Addendum Note (Signed)
Addended by: Modena Nunnery on: 03/24/2014 11:29 AM   Modules accepted: Orders

## 2014-03-24 NOTE — Assessment & Plan Note (Signed)
New. Discussed tx options.  She would prefer injections over daily oral supplementation. B12 IM given in office today. Follow up in 1 month for labs as well as another injection.

## 2014-03-24 NOTE — Patient Instructions (Signed)
It was good to see you. Please come see me in one month.

## 2014-04-28 ENCOUNTER — Ambulatory Visit (INDEPENDENT_AMBULATORY_CARE_PROVIDER_SITE_OTHER): Payer: Medicare Other | Admitting: Family Medicine

## 2014-04-28 ENCOUNTER — Encounter: Payer: Self-pay | Admitting: Family Medicine

## 2014-04-28 VITALS — BP 144/68 | HR 88 | Temp 98.1°F | Ht 65.0 in | Wt 102.1 lb

## 2014-04-28 DIAGNOSIS — E538 Deficiency of other specified B group vitamins: Secondary | ICD-10-CM

## 2014-04-28 DIAGNOSIS — K146 Glossodynia: Secondary | ICD-10-CM

## 2014-04-28 MED ORDER — CYANOCOBALAMIN 1000 MCG/ML IJ SOLN
1000.0000 ug | Freq: Once | INTRAMUSCULAR | Status: AC
  Administered 2014-04-28: 1000 ug via INTRAMUSCULAR

## 2014-04-28 NOTE — Progress Notes (Signed)
Subjective:   Patient ID: Veronica Pierce, female    DOB: 04-Jun-1931, 80 y.o.   MRN: [id number]  Veronica Pierce is a pleasant 80 y.o. year old female who presents to clinic today with Follow-up  on 04/28/2014  HPI:  1 month follow up. H/o tongue CA.  Has been having red, painful tongue. Nystatin rinse did not help.  Saw ENT, Dr. Daria Pastures.  B12 was low so we have started repleting it with monthly injections approximately a month ago.  Tongue pain has resolved.  Lab Results  Component Value Date   TSH 0.632 01/27/2014      Patient Active Problem List   Diagnosis Date Noted  . Painful tongue 04/28/2014  . B12 deficiency 03/24/2014  . Lesion of vulva 02/18/2014  . Oral thrush 02/18/2014  . Cholecystitis 01/27/2014  . Pleural effusion 01/27/2014  . History of throat cancer 01/27/2014  . Abdominal pain, acute, right lower quadrant 01/27/2014  . Nausea and vomiting in adult 01/27/2014  . History of breast cancer in female 01/27/2014  . Acute cholecystitis without calculus 01/27/2014  . Throat cancer   . Breast cancer   . Hypothyroidism   . HTN (hypertension)    Past Medical History  Diagnosis Date  . Throat cancer 2010  . Breast cancer 2010    left   . Lung cancer 2012  . Breast cancer 2013    right  . Hypothyroidism   . HTN (hypertension)    Past Surgical History  Procedure Laterality Date  . Mastectomy Bilateral     2010, 2013  . Mastectomy  bilateral  . Back surgery    . Portacath placement    . Port-a-cath removal    . Cholecystectomy N/A 01/28/2014    Procedure: LAPAROSCOPIC CHOLECYSTECTOMY;  Surgeon: Rolm Bookbinder, MD;  Location: [facility] OR;  Service: General;  Laterality: N/A;   History  Substance Use Topics  . Smoking status: Never Smoker   . Smokeless tobacco: Never Used  . Alcohol Use: No   Family History  Problem Relation Age of Onset  . Diabetes Mother   . Arthritis Father    No Known Allergies Current Outpatient Prescriptions on File Prior to Visit    Medication Sig Dispense Refill  . alendronate (FOSAMAX) 35 MG tablet Take 1 tablet (35 mg total) by mouth every 7 (seven) days. Take with a full glass of water on an empty stomach.  12 tablet  6  . calcium gluconate 500 MG tablet Take 1,000 mg by mouth daily.      Marland Kitchen guaifenesin (MUCUS RELIEF) 400 MG TABS Take 400 mg by mouth daily as needed (congestion).       Marland Kitchen HYDROcodone-acetaminophen (NORCO/VICODIN) 5-325 MG per tablet Take 1-2 tablets by mouth every 4 (four) hours as needed for moderate pain.  30 tablet  0  . letrozole (FEMARA) 2.5 MG tablet Take 1 tablet (2.5 mg total) by mouth daily.  90 tablet  4  . levothyroxine (SYNTHROID, LEVOTHROID) 100 MCG tablet Take 100 mcg by mouth daily before breakfast.      . nystatin (MYCOSTATIN) 100000 UNIT/ML suspension TAKE 5 MLS BY MOUTH 4 TIMES DAILY.  60 mL  0  . pilocarpine (SALAGEN) 5 MG tablet Take 5 mg by mouth every 12 (twelve) hours.       No current facility-administered medications on file prior to visit.   The PMH, PSH, Social History, Family History, Medications, and allergies have been reviewed in [facility], and have been updated if relevant.  Review of Systems    + fatigue but improved  Objective:    BP 144/68  Pulse 88  Temp(Src) 98.1 F (36.7 C) (Oral)  Ht 5\' 5"  (1.651 m)  Wt 95 lb 8 oz (43.319 kg)  BMI 15.89 kg/m2   Physical Exam  Nursing note and vitals reviewed. Constitutional: She appears well-developed and well-nourished. No distress.  Skin: Skin is warm and dry.  Psychiatric: She has a normal mood and affect. Her behavior is normal. Judgment and thought content normal.          Assessment & Plan:   B12 deficiency - Plan: Vitamin B12  Painful tongue No Follow-up on file.

## 2014-04-28 NOTE — Progress Notes (Signed)
Pre visit review using our clinic review tool, if applicable. No additional management support is needed unless otherwise documented below in the visit note. 

## 2014-04-28 NOTE — Assessment & Plan Note (Signed)
IM B12 today and continue monthly for 1 year.  Recheck B12 level today and again in 4-6 months. The patient indicates understanding of these issues and agrees with the plan.

## 2014-04-28 NOTE — Assessment & Plan Note (Signed)
Resolved. Probable caused by B12 def

## 2014-04-28 NOTE — Patient Instructions (Signed)
Great to see you. Please come back in 1 month for B12 injection.

## 2014-04-28 NOTE — Addendum Note (Signed)
Addended by: Emelia Salisbury C on: 04/28/2014 10:13 AM   Modules accepted: Orders

## 2014-04-29 ENCOUNTER — Other Ambulatory Visit: Payer: Medicare Other

## 2014-04-30 ENCOUNTER — Other Ambulatory Visit: Payer: Self-pay

## 2014-04-30 ENCOUNTER — Encounter: Payer: Self-pay | Admitting: *Deleted

## 2014-04-30 LAB — VITAMIN B12: Vitamin B-12: 973 pg/mL — ABNORMAL HIGH (ref 211–911)

## 2014-04-30 MED ORDER — NYSTATIN 100000 UNIT/ML MT SUSP: OROMUCOSAL | Status: DC

## 2014-04-30 NOTE — Telephone Encounter (Signed)
Note left for refill for nystatin suspension to [facility] [address].Please advise.

## 2014-04-30 NOTE — Telephone Encounter (Signed)
Pt notified nystatin was refilled.

## 2014-06-02 ENCOUNTER — Other Ambulatory Visit: Payer: Self-pay

## 2014-06-02 ENCOUNTER — Ambulatory Visit (INDEPENDENT_AMBULATORY_CARE_PROVIDER_SITE_OTHER): Payer: Medicare Other

## 2014-06-02 DIAGNOSIS — E538 Deficiency of other specified B group vitamins: Secondary | ICD-10-CM

## 2014-06-02 MED ORDER — HYDROCODONE-ACETAMINOPHEN 5-325 MG PO TABS: 1.0000 | ORAL_TABLET | ORAL | Status: DC | PRN

## 2014-06-02 MED ORDER — CYANOCOBALAMIN 1000 MCG/ML IJ SOLN
1000.0000 ug | Freq: Once | INTRAMUSCULAR | Status: AC
  Administered 2014-06-02: 1000 ug via INTRAMUSCULAR

## 2014-06-02 NOTE — Telephone Encounter (Signed)
Lm on pts vm informing her Rx is available for pickup at the front desk; pt informed UDS to be completed to update substance contract

## 2014-06-02 NOTE — Telephone Encounter (Signed)
Pt request rx hydrocodone apap. Call when ready for pick up. Pt is presently finishing oxycodone and pt request refill hydrocodone apap for back pain. Pt only uses prn at night for back pain.

## 2014-06-03 ENCOUNTER — Encounter: Payer: Self-pay | Admitting: Family Medicine

## 2014-06-05 ENCOUNTER — Telehealth: Payer: Self-pay | Admitting: Hematology and Oncology

## 2014-06-05 NOTE — Telephone Encounter (Signed)
, °

## 2014-06-09 ENCOUNTER — Telehealth: Payer: Self-pay | Admitting: Hematology and Oncology

## 2014-06-09 NOTE — Telephone Encounter (Signed)
, °

## 2014-06-12 ENCOUNTER — Other Ambulatory Visit: Payer: Medicare Other

## 2014-06-12 ENCOUNTER — Ambulatory Visit: Payer: Medicare Other | Admitting: Oncology

## 2014-06-25 ENCOUNTER — Telehealth: Payer: Self-pay | Admitting: *Deleted

## 2014-06-25 NOTE — Telephone Encounter (Signed)
Spoke with patient to reschedule her appointment due to Dr. Renella Cunas delay.  Confirmed new appointment for 07/06/14 at 66 for labs and 1pm with Dr. Lona Kettle.

## 2014-06-29 ENCOUNTER — Ambulatory Visit: Payer: Medicare Other

## 2014-06-29 ENCOUNTER — Encounter: Payer: Self-pay | Admitting: Family Medicine

## 2014-06-29 ENCOUNTER — Other Ambulatory Visit: Payer: Medicare Other

## 2014-07-06 ENCOUNTER — Other Ambulatory Visit (HOSPITAL_BASED_OUTPATIENT_CLINIC_OR_DEPARTMENT_OTHER): Payer: Medicare Other

## 2014-07-06 ENCOUNTER — Ambulatory Visit (HOSPITAL_BASED_OUTPATIENT_CLINIC_OR_DEPARTMENT_OTHER): Payer: Medicare Other | Admitting: Hematology

## 2014-07-06 VITALS — BP 183/77 | HR 96 | Temp 98.9°F | Resp 18 | Ht 65.0 in | Wt 104.5 lb

## 2014-07-06 DIAGNOSIS — C8581 Other specified types of non-Hodgkin lymphoma, lymph nodes of head, face, and neck: Secondary | ICD-10-CM

## 2014-07-06 DIAGNOSIS — Z8589 Personal history of malignant neoplasm of other organs and systems: Secondary | ICD-10-CM

## 2014-07-06 DIAGNOSIS — C50912 Malignant neoplasm of unspecified site of left female breast: Secondary | ICD-10-CM

## 2014-07-06 DIAGNOSIS — M810 Age-related osteoporosis without current pathological fracture: Secondary | ICD-10-CM

## 2014-07-06 DIAGNOSIS — C50919 Malignant neoplasm of unspecified site of unspecified female breast: Secondary | ICD-10-CM

## 2014-07-06 DIAGNOSIS — Z170 Estrogen receptor positive status [ER+]: Secondary | ICD-10-CM

## 2014-07-06 LAB — COMPREHENSIVE METABOLIC PANEL (CC13)
ALT: 16 U/L (ref 0–55)
ANION GAP: 10 meq/L (ref 3–11)
AST: 22 U/L (ref 5–34)
Albumin: 3.7 g/dL (ref 3.5–5.0)
Alkaline Phosphatase: 66 U/L (ref 40–150)
BILIRUBIN TOTAL: 0.43 mg/dL (ref 0.20–1.20)
BUN: 19.4 mg/dL (ref 7.0–26.0)
CALCIUM: 10.1 mg/dL (ref 8.4–10.4)
CHLORIDE: 103 meq/L (ref 98–109)
CO2: 27 meq/L (ref 22–29)
Creatinine: 0.9 mg/dL (ref 0.6–1.1)
Glucose: 110 mg/dl (ref 70–140)
Potassium: 4.5 mEq/L (ref 3.5–5.1)
SODIUM: 140 meq/L (ref 136–145)
TOTAL PROTEIN: 7.8 g/dL (ref 6.4–8.3)

## 2014-07-06 LAB — CBC WITH DIFFERENTIAL/PLATELET
BASO%: 0.3 % (ref 0.0–2.0)
Basophils Absolute: 0 10*3/uL (ref 0.0–0.1)
EOS%: 1.4 % (ref 0.0–7.0)
Eosinophils Absolute: 0.1 10*3/uL (ref 0.0–0.5)
HCT: 42.6 % (ref 34.8–46.6)
HGB: 13.8 g/dL (ref 11.6–15.9)
LYMPH#: 1.1 10*3/uL (ref 0.9–3.3)
LYMPH%: 13.6 % — ABNORMAL LOW (ref 14.0–49.7)
MCH: 30.7 pg (ref 25.1–34.0)
MCHC: 32.5 g/dL (ref 31.5–36.0)
MCV: 94.7 fL (ref 79.5–101.0)
MONO#: 0.6 10*3/uL (ref 0.1–0.9)
MONO%: 7.4 % (ref 0.0–14.0)
NEUT#: 6.2 10*3/uL (ref 1.5–6.5)
NEUT%: 77.3 % — ABNORMAL HIGH (ref 38.4–76.8)
Platelets: 186 10*3/uL (ref 145–400)
RBC: 4.49 10*6/uL (ref 3.70–5.45)
RDW: 13.5 % (ref 11.2–14.5)
WBC: 8 10*3/uL (ref 3.9–10.3)

## 2014-07-06 NOTE — Progress Notes (Signed)
Flynn Lininger [id number] Jul 15, 1931 80 y.o. 07/06/2014 7:28 PM  CC  Arnette Norris, MD [place] [address]  PATIENT IDENTIFICATION:  80 year old female with multiple malignancies including squamous cell carcinoma of the head and neck, bilateral breast cancer.   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 Arnette Norris  Prior Oncologic history:  Veronica Pierce is a 80 y.o. female.    #1 As oncologic history dates back to February March 2010 when she developed a lump in her right neck. She also noticed hoarseness and difficulty with having to clear her throat. She was seen at primary care physician's office and was initially given antibiotics with some improvement but it ultimately the symptoms did not resolve. She was seen [facility] by Dr. Sherryle Lis who performed an examination. She was found to have a tumor in the right pharyngeal wall extending to the larynx with fixation of the right vocal cord approximately 2 cm in size with a fixed mass in the right upper neck. Patient had a biopsy performed on 05/07/2009. She was found to have moderately differentiated squamous cell carcinoma involving the right. aryepiglottic fold,) she'll wall and epiglottis. 05/27/2009 patient was treated with a weekly cis-platinum with radiation regimen on 06/10/2009 -07/29/2009.  #2 Patient also had a screening mammogram on 07/23/2009 which showed evidence of  abnormality in the left breast.after completing her cis-platinum she went on to  have further imaging studies of the breasts performed on 08/04/2009. This showed a highly suspicious abnormality in the left breast. She underwent FNA of left breast mass on 08/10/2009. The pathology was suspicious for breast cancer.  On 08/26/2009 patient had a mastectomy performed with the final pathology revealing a 2 cm tumor was well differentiated infiltrating ductal carcinoma with extensive calcifications. DCIS component comprised approximately 40%. There was no angiolymphatic invasion. All margins were negative. Sentinel nodes were negative for metastatic disease tumor was estrogen receptor positive at 80% progesterone receptor +10% and HER-2/neu by fish negative.   #3 She had an Oncotype DX testing performed which showed an elevated Oncotype score with a recurrence score of 41 with an associated risk of recurrence of 28% with tamoxifen alone. She had staging studies performed that showed no evidence of metastatic disease. On 10/29/2009 patient began chemotherapy consisting of Taxotere and Cytoxan she received a total of 4 cycles last cycle was on 01/03/2010. She was then begun on aromatase inhibitor consisting of Arimidex 1 mg daily. Patient also had a bone density scan performed which showed osteoporosis she was recommended reclast? but could not receive it due to to insurance issues.   #3 On April 2012 patient had restaging CT performed that showed evidence of pulmonary nodules in the right lung PET scan on 04/09/2011 showed increased activity. 05/23/2011 it was recommended that patient receive radiosurgery for the lung lesion. She was referred to radiation in [place] on 06/26/2011. After getting treated she had a followup PET scan performed that showed resolution of the lesion in the right base and right upper lobe of the lung.   #4 03/06/2012 patient had a mammogram performed that  showed a 1.5 cm palpable suspicious mass in the outer aspect of the breast. She had a biopsy performed on  03/12/2012 that was consistent with infiltrating carcinoma with desmoplastic response. She had a simple mastectomy on 04/11/2012 with sentinel lymph node biopsy. The final pathology showed a 1.2 cm infiltrating ductal carcinoma tumor was ER negative PR negative and HER-2/neu negative. She did not receive any further chemotherapy and was observed only last PET scan performed on 12/01/2012 showed no evidence of malignancy recurrence but there was evidence of new L1 compression fracture. She underwent a kyphoplasty procedure with improvement of her back.  Her last PET scan was performed on 06/02/2013 and compared to a prior scan from 12/01/2012. Impression was approximately 1.5 cm in maximum diameter nodular opacity involving the medial aspect of the right middle lobe more staining mild uptake with SUV maximum 1.9. Previously this nodular opacity was subcentimeter. Slight progression in region of consolidation involving the anterolateral aspect of the right lower pulmonary lobe abutting the pleura with SUV maximum 1.8 compared to 1.5 PREVIOUSLY. minimal increase in trace right pleural effusion. Tiny right apical pneumothorax new compared to previous COPD and pulmonary parenchymal scarring as L1 kyphoplasty new.  A CT ANGIOGRAM was performed on 01/27/2014 and showed #1 there is no evidence of acute pulmonary embolism nor acute thoracic aortic pathology. #2 moderate size right pleural effusion was noted 3 the stable appearance of the pleural and parenchymal scarring bilaterally Chest x-ray performed on 01/27/2014 was rather stable right-sided interspace disease and a small effusion which was felt to be unchanged  Current therapy: Letrozole 2.5 mg daily.  Interval history: Patient is seen in followup today. Overall she looks terrific. She is scheduled into her house and [place]. She is very happy with the move. She denies any headaches double vision blurring of vision fevers chills or night sweats. No nausea  or vomiting no myalgias and arthralgias. Remainder of the 10 point review of systems is negative. She does complain of having some thick mucus and uses liquid Robitussin her blood pressure is slightly elevated in the office her family doctor and have also started her on vitamin B 12 shots once a month and is giving her some energy. She does take Vicodin for pain. She is not complaining of any cough or dyspnea on exertion or any chest pain.  Past Medical History: Past Medical History  Diagnosis Date  . Throat cancer 2010  . Breast cancer 2010    left   . Lung cancer 2012  . Breast cancer 2013    right  . Hypothyroidism   . HTN (hypertension)     Past Surgical History: Past Surgical History  Procedure Laterality Date  . Mastectomy Bilateral     2010, 2013  . Mastectomy  bilateral  . Back surgery    . Portacath placement    . Port-a-cath removal    . Cholecystectomy N/A 01/28/2014    Procedure: LAPAROSCOPIC CHOLECYSTECTOMY;  Surgeon: Rolm Bookbinder, MD;  Location: [facility] OR;  Service: General;  Laterality: N/A;    Family History: Family History  Problem Relation Age of Onset  . Diabetes Mother   . Arthritis Father     Social History History  Substance Use Topics  . Smoking status: Never Smoker   . Smokeless tobacco: Never Used  . Alcohol Use: No    Allergies: No Known Allergies  Current Medications: Current Outpatient Prescriptions  Medication Sig Dispense Refill  . alendronate (FOSAMAX) 35 MG tablet Take 1 tablet (  35 mg total) by mouth every 7 (seven) days. Take with a full glass of water on an empty stomach.  12 tablet  6  . calcium gluconate 500 MG tablet Take 1,000 mg by mouth daily.      . cyanocobalamin (,VITAMIN B-12,) 1000 MCG/ML injection Inject 1,000 mcg into the muscle every 30 (thirty) days.      Marland Kitchen guaifenesin (MUCUS RELIEF) 400 MG TABS Take 400 mg by mouth daily as needed (congestion).       Marland Kitchen HYDROcodone-acetaminophen (NORCO/VICODIN) 5-325 MG per tablet  Take 1-2 tablets by mouth every 4 (four) hours as needed for moderate pain.  30 tablet  0  . letrozole (FEMARA) 2.5 MG tablet Take 1 tablet (2.5 mg total) by mouth daily.  90 tablet  4  . levothyroxine (SYNTHROID, LEVOTHROID) 100 MCG tablet Take 100 mcg by mouth daily before breakfast.      . pilocarpine (SALAGEN) 5 MG tablet Take 5 mg by mouth every 12 (twelve) hours.       No current facility-administered medications for this visit.    OB/GYN History:menarche at age 63 patient is postmenopausal no history of hormone replacement therapy first live birth at 20  Fertility Discussion:not applicable Prior History of Cancer: yes see history of present illness  Health Maintenance:  Colonoscopy no Bone Density yes 2011 Last PAP smear unknown  ECOG PERFORMANCE STATUS: 0 - Asymptomatic  Genetic Counseling/testing: no  REVIEW OF SYSTEMS:  Constitutional: positive for fatigue Ears, nose, mouth, throat, and face: positive for hoarseness Respiratory: positive for dyspnea on exertion Cardiovascular: negative Gastrointestinal: positive for dysphagia Genitourinary:negative Integument/breast: positive for Bilateral mastectomy Hematologic/lymphatic: negative Musculoskeletal:negative Neurological: negative Behavioral/Psych: negative  PHYSICAL EXAMINATION: Blood pressure 183/77, pulse 96, temperature 98.9 F (37.2 C), temperature source Oral, resp. rate 18, height 5' 5"  (1.651 m), weight 104 lb 8 oz (47.401 kg).  ZES:PQZRA, healthy, no distress and well developed SKIN: skin color, texture, turgor are normal HEAD: Normocephalic EYES: PERRLA, EOMI, Conjunctiva are pink and non-injected EARS: External ears normal OROPHARYNX:no exudate, no erythema and lips, buccal mucosa, and tongue normal  NECK: supple, no adenopathy, thyroid normal size, non-tender, without nodularity LYMPH:  no palpable lymphadenopathy BREAST:patient has had bilateral mastectomies LUNGS: clear to auscultation and  percussion HEART: regular rate & rhythm. SYSTOLIC murmur 0-7/6 ABDOMEN:abdomen soft, non-tender, normal bowel sounds and no masses or organomegaly BACK: Back symmetric, no curvature., No CVA tenderness, Range of motion is normal EXTREMITIES:less then 2 second capillary refill, no edema, no clubbing, no cyanosis  NEURO: alert & oriented x 3 with fluent speech, no focal motor/sensory deficits, gait normal     STUDIES/RESULTS: No results found.   LABS:    Chemistry      Component Value Date/Time   NA 140 07/06/2014 1230   NA 143 01/30/2014 0724   K 4.5 07/06/2014 1230   K 3.8 01/30/2014 0724   CL 108 01/30/2014 0724   CO2 27 07/06/2014 1230   CO2 24 01/30/2014 0724   BUN 19.4 07/06/2014 1230   BUN 14 01/30/2014 0724   CREATININE 0.9 07/06/2014 1230   CREATININE 0.54 01/30/2014 0724      Component Value Date/Time   CALCIUM 10.1 07/06/2014 1230   CALCIUM 8.2* 01/30/2014 0724   ALKPHOS 66 07/06/2014 1230   ALKPHOS 74 01/30/2014 0724   AST 22 07/06/2014 1230   AST 34 01/30/2014 0724   ALT 16 07/06/2014 1230   ALT 51* 01/30/2014 0724   BILITOT 0.43 07/06/2014 1230  BILITOT 0.5 01/30/2014 0724      Lab Results  Component Value Date   WBC 8.0 07/06/2014   HGB 13.8 07/06/2014   HCT 42.6 07/06/2014   MCV 94.7 07/06/2014   PLT 186 07/06/2014    ASSESSMENT    80 year old female with  #1 history of head and neck cancer status post concurrent radiation and chemotherapy originally diagnosed in 2010. In Remission.  #2 diagnosis of left breast cancer infiltrating ductal ER positive PR positive HER-2/neu negative on letrozole 2.5 mg daily. Patient did receive adjuvant chemotherapy consisting of Taxotere and Cytoxan x4 cycles for a high risk Oncotype DX. S/p Mastectomy.  #3 right breast cancer at measuring 1.2 cm that was ER negative PR negative HER-2/neu negative status post mastectomy on observation only  #4 history of osteoporosis   #5 right pubic ramus fracture  #6 2 pulmonary nodule identified in  April 2012 biopsied in May 2012 consistent with undifferentiated carcinoma,status post radiosurgery  #7 L1 compression fracture diagnosed December 2013   PLAN:    #1 we will continue letrozole 2.5 mg daily.  #2 osteoporosis: Patient will continue Fosamax once a week  #3 I reviewed her recent CAT scans and chest x-ray from this year at this time she is asymptomatic therefore I will wait for repeating the restaging PET scan. I will plan the visit in January 2016 with blood work, PET scan before her visit. If she develops any new symptoms she is instructed to call us.  Thank you so much for allowing me to participate in the care of Veronica Pierce. I will continue to follow up the patient with you and assist in her care.  All questions were answered. The patient knows to call the clinic with any problems, questions or concerns. We can certainly see the patient much sooner if necessary.  I spent 30 minutes counseling the patient face to face. The total time spent in the appointment was 30 minutes.   Bernadene Bell, MD Medical Hematologist/Oncologist [place] Pager: [phone] Office No: [phone]

## 2014-07-07 ENCOUNTER — Telehealth: Payer: Self-pay

## 2014-07-07 ENCOUNTER — Other Ambulatory Visit: Payer: Self-pay

## 2014-07-07 ENCOUNTER — Ambulatory Visit (INDEPENDENT_AMBULATORY_CARE_PROVIDER_SITE_OTHER): Payer: Medicare Other

## 2014-07-07 DIAGNOSIS — E538 Deficiency of other specified B group vitamins: Secondary | ICD-10-CM

## 2014-07-07 MED ORDER — CYANOCOBALAMIN 1000 MCG/ML IJ SOLN
1000.0000 ug | Freq: Once | INTRAMUSCULAR | Status: AC
  Administered 2014-07-07: 1000 ug via INTRAMUSCULAR

## 2014-07-07 MED ORDER — HYDROCODONE-ACETAMINOPHEN 5-325 MG PO TABS: 1.0000 | ORAL_TABLET | ORAL | Status: DC | PRN

## 2014-07-07 NOTE — Telephone Encounter (Signed)
Pt request rx hydrocodone apap. Call when ready for pick up.

## 2014-07-07 NOTE — Telephone Encounter (Signed)
Spoke to pt and informed her Rx is available for pickup from the front desk

## 2014-07-09 ENCOUNTER — Telehealth: Payer: Self-pay | Admitting: Hematology

## 2014-07-09 NOTE — Telephone Encounter (Signed)
Called pt and advised pt Dr. Lona Kettle next appt is in 6 months after pet scan. Gave pt appt for 01/19/15 @ 10.30am with Dr. Lindi Adie. Cx'd 9/8 appt with Lindi Adie and mailed pt Jan 2016 appt calendar.

## 2014-07-15 NOTE — Telephone Encounter (Signed)
Error; refill note already opened.

## 2014-07-24 ENCOUNTER — Other Ambulatory Visit: Payer: Self-pay

## 2014-07-24 MED ORDER — HYDROCODONE-ACETAMINOPHEN 5-325 MG PO TABS: 1.0000 | ORAL_TABLET | ORAL | Status: DC | PRN

## 2014-07-24 NOTE — Telephone Encounter (Signed)
Veronica Pierce pts daughter request rx hydrocodone apap. Call when ready for pick up. Pt has enough med to last until 07/27/14.

## 2014-07-27 MED ORDER — HYDROCODONE-ACETAMINOPHEN 5-325 MG PO TABS: 1.0000 | ORAL_TABLET | ORAL | Status: DC | PRN

## 2014-07-27 NOTE — Addendum Note (Signed)
Addended by: Modena Nunnery on: 07/27/2014 12:29 PM   Modules accepted: Orders

## 2014-07-27 NOTE — Telephone Encounter (Signed)
Rx filled 07/14. Spoke to pt and asked how often she takes meds. Pt indicates that she normally only takes 1tab daily, occasionally taking 2. Pt states that her daughter manages meds and informed her that she only had one tab left. Per Dr Deborra Medina, noted.  Advised pt that Rx is available for pickup.

## 2014-08-11 ENCOUNTER — Telehealth (INDEPENDENT_AMBULATORY_CARE_PROVIDER_SITE_OTHER): Payer: Medicare Other

## 2014-08-11 ENCOUNTER — Other Ambulatory Visit (INDEPENDENT_AMBULATORY_CARE_PROVIDER_SITE_OTHER): Payer: Medicare Other

## 2014-08-11 ENCOUNTER — Ambulatory Visit (INDEPENDENT_AMBULATORY_CARE_PROVIDER_SITE_OTHER): Payer: Medicare Other

## 2014-08-11 DIAGNOSIS — E538 Deficiency of other specified B group vitamins: Secondary | ICD-10-CM

## 2014-08-11 LAB — VITAMIN B12: Vitamin B-12: >1500 pg/mL — ABNORMAL HIGH (ref 211–911)

## 2014-08-11 MED ORDER — CYANOCOBALAMIN 1000 MCG/ML IJ SOLN
1000.0000 ug | Freq: Once | INTRAMUSCULAR | Status: AC
  Administered 2014-08-11: 1000 ug via INTRAMUSCULAR

## 2014-08-11 NOTE — Telephone Encounter (Signed)
Lm on pts vm requesting a call back 

## 2014-08-11 NOTE — Telephone Encounter (Signed)
Pt was in today for 5th Vit b 12 injection; pt wants to know if she could stop getting B 12 injections and start oral Vit B 12. Pt wanted to know when should have next Vit B 12 blood test. Pt request cb.

## 2014-08-11 NOTE — Telephone Encounter (Signed)
Yes ok to check B12 now and to start B12 500 mcg by mouth daily.

## 2014-08-11 NOTE — Telephone Encounter (Signed)
Pt's daughter, Jackelyn Poling returned your call. Please call her back at [phone]. Thank you.

## 2014-08-11 NOTE — Telephone Encounter (Signed)
Spoke to [place] and advised per Dr Deborra Medina. Repeat labs ordered and scheduled.

## 2014-08-28 ENCOUNTER — Ambulatory Visit (INDEPENDENT_AMBULATORY_CARE_PROVIDER_SITE_OTHER): Payer: Medicare Other | Admitting: Family Medicine

## 2014-08-28 VITALS — BP 158/84 | HR 84 | Temp 97.5°F | Wt 105.5 lb

## 2014-08-28 DIAGNOSIS — E538 Deficiency of other specified B group vitamins: Secondary | ICD-10-CM

## 2014-08-28 DIAGNOSIS — Z23 Encounter for immunization: Secondary | ICD-10-CM

## 2014-08-28 DIAGNOSIS — R200 Anesthesia of skin: Secondary | ICD-10-CM | POA: Insufficient documentation

## 2014-08-28 DIAGNOSIS — R209 Unspecified disturbances of skin sensation: Secondary | ICD-10-CM

## 2014-08-28 MED ORDER — HYDROCODONE-ACETAMINOPHEN 5-325 MG PO TABS: 1.0000 | ORAL_TABLET | ORAL | Status: DC | PRN

## 2014-08-28 NOTE — Patient Instructions (Signed)
Great to see you. Please restart B12 capsules- 500 mcg daily.  We will recheck your labs in about 2 months.

## 2014-08-28 NOTE — Progress Notes (Signed)
Subjective:   Patient ID: Veronica Pierce, female    DOB: [birth year], 80 y.o.   MRN: [id number]  Veronica Pierce is a pleasant 80 y.o. year old female who presents to clinic today with Tingling  on 08/28/2014  HPI: Vit B12 deficiency-   H/o tongue CA.  Was having red, painful tongue. Nystatin rinse did not help.  Saw ENT, Dr. Daria Pastures in 04/2014.  B12 was low so we have been repleting it with monthly injections approximately a month ago.  Tongue pain has resolved.  Fatigue improved.  Last B12 injection was 8/18.  Lab Results  Component Value Date   TSH 0.632 01/27/2014   Lab Results  Component Value Date   VITAMINB12 >1500* 08/11/2014   Has been having tingling in her feet.  Notices it mainly at night for past few weeks.  Just in her toes, no tingling in bottom of foot.  No numbness.    Patient Active Problem List   Diagnosis Date Noted  . Numbness in feet 08/28/2014  . Painful tongue 04/28/2014  . B12 deficiency 03/24/2014  . Lesion of vulva 02/18/2014  . Oral thrush 02/18/2014  . Cholecystitis 01/27/2014  . Pleural effusion 01/27/2014  . History of throat cancer 01/27/2014  . Abdominal pain, acute, right lower quadrant 01/27/2014  . Nausea and vomiting in adult 01/27/2014  . History of breast cancer in female 01/27/2014  . Acute cholecystitis without calculus 01/27/2014  . Throat cancer   . Breast cancer   . Hypothyroidism   . HTN (hypertension)    Past Medical History  Diagnosis Date  . Throat cancer 2010  . Breast cancer 2010    left   . Lung cancer 2012  . Breast cancer 2013    right  . Hypothyroidism   . HTN (hypertension)    Past Surgical History  Procedure Laterality Date  . Mastectomy Bilateral     2010, 2013  . Mastectomy  bilateral  . Back surgery    . Portacath placement    . Port-a-cath removal    . Cholecystectomy N/A 01/28/2014    Procedure: LAPAROSCOPIC CHOLECYSTECTOMY;  Surgeon: Rolm Bookbinder, MD;  Location: [facility] OR;  Service: General;  Laterality:  N/A;   History  Substance Use Topics  . Smoking status: Never Smoker   . Smokeless tobacco: Never Used  . Alcohol Use: No   Family History  Problem Relation Age of Onset  . Diabetes Mother   . Arthritis Father    No Known Allergies Current Outpatient Prescriptions on File Prior to Visit  Medication Sig Dispense Refill  . alendronate (FOSAMAX) 35 MG tablet Take 1 tablet (35 mg total) by mouth every 7 (seven) days. Take with a full glass of water on an empty stomach.  12 tablet  6  . calcium gluconate 500 MG tablet Take 1,000 mg by mouth daily.      . cyanocobalamin (,VITAMIN B-12,) 1000 MCG/ML injection Inject 1,000 mcg into the muscle every 30 (thirty) days.      Marland Kitchen guaifenesin (MUCUS RELIEF) 400 MG TABS Take 400 mg by mouth daily as needed (congestion).       Marland Kitchen letrozole (FEMARA) 2.5 MG tablet Take 1 tablet (2.5 mg total) by mouth daily.  90 tablet  4  . levothyroxine (SYNTHROID, LEVOTHROID) 100 MCG tablet Take 100 mcg by mouth daily before breakfast.      . pilocarpine (SALAGEN) 5 MG tablet Take 5 mg by mouth every 12 (twelve) hours.  No current facility-administered medications on file prior to visit.   The PMH, PSH, Social History, Family History, Medications, and allergies have been reviewed in [facility], and have been updated if relevant.   Review of Systems    + fatigue but improved No UE tingling No UE or LE weakness Objective:    BP 158/84  Pulse 84  Temp(Src) 97.5 F (36.4 C) (Oral)  Wt 105 lb 8 oz (47.854 kg)  SpO2 96%   Physical Exam  Nursing note and vitals reviewed. Constitutional: She appears well-developed and well-nourished. No distress.  Skin: Skin is warm and dry.  Psychiatric: She has a normal mood and affect. Her behavior is normal. Judgment and thought content normal.  Feet: Normal inspection No skin breakdown No calluses  Normal DP pulses Normal sensation to light touch and monofilament Nails normal        Assessment & Plan:   B12  deficiency  Numbness in feet No Follow-up on file.

## 2014-08-28 NOTE — Assessment & Plan Note (Signed)
Resolved.  B12 now high. Will d/c injections at this point, start lower dose OTC supplementation. Repeat B12 level in 8 weeks. The patient indicates understanding of these issues and agrees with the plan.

## 2014-08-28 NOTE — Assessment & Plan Note (Signed)
New- intermittent. Unclear source of neuropathy. I would think that if this was related to B12, it would have started when her B12 is low but this is always a possibility. She will keep her feel elevated when seated and update me in a few weeks.

## 2014-08-28 NOTE — Progress Notes (Signed)
Pre visit review using our clinic review tool, if applicable. No additional management support is needed unless otherwise documented below in the visit note. 

## 2014-09-01 ENCOUNTER — Ambulatory Visit: Payer: Medicare Other | Admitting: Hematology and Oncology

## 2014-09-01 ENCOUNTER — Other Ambulatory Visit: Payer: Medicare Other

## 2014-09-28 ENCOUNTER — Other Ambulatory Visit: Payer: Self-pay

## 2014-09-28 MED ORDER — HYDROCODONE-ACETAMINOPHEN 5-325 MG PO TABS: 1.0000 | ORAL_TABLET | ORAL | Status: DC | PRN

## 2014-09-28 NOTE — Telephone Encounter (Signed)
Spoke to pt and informed her Rx is available for pickup from the front desk

## 2014-09-28 NOTE — Telephone Encounter (Signed)
Pt's daughter,Veronica Pierce left v/m requesting rx hydrocodone apap. Call when ready for pick up.Veronica Pierce has appt at [facility] on 09/30/14 and would like to pick up rx at that time.Please advise.

## 2014-10-14 ENCOUNTER — Other Ambulatory Visit: Payer: Self-pay | Admitting: *Deleted

## 2014-10-14 MED ORDER — LEVOTHYROXINE SODIUM 100 MCG PO TABS: 100.0000 ug | ORAL_TABLET | Freq: Every day | ORAL | Status: DC

## 2014-10-20 ENCOUNTER — Other Ambulatory Visit: Payer: Self-pay | Admitting: Oncology

## 2014-10-23 ENCOUNTER — Other Ambulatory Visit: Payer: Self-pay | Admitting: Oncology

## 2014-10-28 ENCOUNTER — Other Ambulatory Visit (INDEPENDENT_AMBULATORY_CARE_PROVIDER_SITE_OTHER): Payer: Medicare Other

## 2014-10-28 ENCOUNTER — Telehealth: Payer: Self-pay | Admitting: Family Medicine

## 2014-10-28 DIAGNOSIS — R200 Anesthesia of skin: Secondary | ICD-10-CM

## 2014-10-28 DIAGNOSIS — R208 Other disturbances of skin sensation: Secondary | ICD-10-CM

## 2014-10-28 DIAGNOSIS — E538 Deficiency of other specified B group vitamins: Secondary | ICD-10-CM

## 2014-10-28 LAB — VITAMIN B12: VITAMIN B 12: 287 pg/mL (ref 211–911)

## 2014-10-28 MED ORDER — HYDROCODONE-ACETAMINOPHEN 5-325 MG PO TABS: 1.0000 | ORAL_TABLET | ORAL | Status: DC | PRN

## 2014-10-28 NOTE — Telephone Encounter (Signed)
Pt came in for labs  She needs a refill hydrocodone

## 2014-10-28 NOTE — Telephone Encounter (Signed)
Pt requesting medication refill. Last f/u appt 08/2014. Ok to fill per Dr Deborra Medina. Lm on pts vm informing her Rx will be available for pickup after 1400 today

## 2014-11-30 ENCOUNTER — Other Ambulatory Visit: Payer: Self-pay

## 2014-11-30 MED ORDER — HYDROCODONE-ACETAMINOPHEN 5-325 MG PO TABS: 1.0000 | ORAL_TABLET | ORAL | Status: DC | PRN

## 2014-11-30 NOTE — Telephone Encounter (Signed)
Pt's daughter left v/m requesting rx hydrocodone apap. Call when ready for pick up. Pt has 2 pills left.

## 2014-11-30 NOTE — Telephone Encounter (Signed)
Spoke to pt and informed her Rx is available for pickup from the front desk. Pt advised Rx cannot be picked up by third party

## 2014-12-01 ENCOUNTER — Encounter: Payer: Self-pay | Admitting: Family Medicine

## 2014-12-11 ENCOUNTER — Other Ambulatory Visit: Payer: Self-pay | Admitting: *Deleted

## 2014-12-11 MED ORDER — LEVOTHYROXINE SODIUM 100 MCG PO TABS: 100.0000 ug | ORAL_TABLET | Freq: Every day | ORAL | Status: DC

## 2014-12-16 ENCOUNTER — Encounter: Payer: Self-pay | Admitting: Family Medicine

## 2014-12-16 ENCOUNTER — Ambulatory Visit (INDEPENDENT_AMBULATORY_CARE_PROVIDER_SITE_OTHER): Payer: Medicare Other | Admitting: Family Medicine

## 2014-12-16 VITALS — BP 156/92 | HR 101 | Temp 98.1°F | Wt 107.2 lb

## 2014-12-16 DIAGNOSIS — Z23 Encounter for immunization: Secondary | ICD-10-CM

## 2014-12-16 DIAGNOSIS — E538 Deficiency of other specified B group vitamins: Secondary | ICD-10-CM

## 2014-12-16 DIAGNOSIS — J069 Acute upper respiratory infection, unspecified: Secondary | ICD-10-CM

## 2014-12-16 MED ORDER — BENZONATATE 100 MG PO CAPS: 100.0000 mg | ORAL_CAPSULE | Freq: Two times a day (BID) | ORAL | Status: DC | PRN

## 2014-12-16 MED ORDER — CYANOCOBALAMIN 1000 MCG/ML IJ SOLN
1000.0000 ug | Freq: Once | INTRAMUSCULAR | Status: AC
  Administered 2014-12-16: 1000 ug via INTRAMUSCULAR

## 2014-12-16 MED ORDER — AZITHROMYCIN 250 MG PO TABS: ORAL_TABLET | ORAL | Status: DC

## 2014-12-16 NOTE — Progress Notes (Signed)
Subjective:   Patient ID: Veronica Pierce, female    DOB: 04/06/1931, 80 y.o.   MRN: [id number]  Veronica Pierce is a pleasant 80 y.o. year old female who presents to clinic today with her husband and daughter for Cough and Nasal Congestion  on 12/16/2014  HPI: Cough- progressive past over 1 week.  Acutely worse 4 days ago.  Always has thick mucous since her diagnosis of throat CA but it is much thicker now.  Coughing up thick, yellowish phlegm.  Also has a lot of nasal congestion.  + sore throat. No CP.  No SOB except when coughing. No nausea or vomiting. She is fatigued, also having more tingling in her feet- h/o vit B12 def. Lab Results  Component Value Date   VITAMINB12 287 10/28/2014   Takes hydrocodone nightly at bedtime- still waking up with cough.  Current Outpatient Prescriptions on File Prior to Visit  Medication Sig Dispense Refill  . alendronate (FOSAMAX) 35 MG tablet TAKE 1 TABLET BY MOUTH EVERY 7 DAYS. TAKE WITH A FULL GLASS OF WATER ON AN EMPTY STOMACH 12 tablet 0  . calcium gluconate 500 MG tablet Take 1,000 mg by mouth daily.    . cyanocobalamin (,VITAMIN B-12,) 1000 MCG/ML injection Inject 1,000 mcg into the muscle every 30 (thirty) days.    Marland Kitchen guaifenesin (MUCUS RELIEF) 400 MG TABS Take 400 mg by mouth daily as needed (congestion).     Marland Kitchen HYDROcodone-acetaminophen (NORCO/VICODIN) 5-325 MG per tablet Take 1-2 tablets by mouth every 4 (four) hours as needed for moderate pain. 60 tablet 0  . letrozole (FEMARA) 2.5 MG tablet TAKE 1 TABLET (2.5 MG TOTAL) BY MOUTH DAILY. 90 tablet 3  . levothyroxine (SYNTHROID, LEVOTHROID) 100 MCG tablet Take 1 tablet (100 mcg total) by mouth daily before breakfast. 30 tablet 2  . pilocarpine (SALAGEN) 5 MG tablet Take 5 mg by mouth every 12 (twelve) hours.     No current facility-administered medications on file prior to visit.    No Known Allergies  Past Medical History  Diagnosis Date  . Throat cancer 2010  . Breast cancer 2010    left     . Lung cancer 2012  . Breast cancer 2013    right  . Hypothyroidism   . HTN (hypertension)     Past Surgical History  Procedure Laterality Date  . Mastectomy Bilateral     2010, 2013  . Mastectomy  bilateral  . Back surgery    . Portacath placement    . Port-a-cath removal    . Cholecystectomy N/A 01/28/2014    Procedure: LAPAROSCOPIC CHOLECYSTECTOMY;  Surgeon: Rolm Bookbinder, MD;  Location: [facility] OR;  Service: General;  Laterality: N/A;    Family History  Problem Relation Age of Onset  . Diabetes Mother   . Arthritis Father     History   Social History  . Marital Status: Married    Spouse Name: N/A    Number of Children: N/A  . Years of Education: N/A   Occupational History  . Not on file.   Social History Main Topics  . Smoking status: Never Smoker   . Smokeless tobacco: Never Used  . Alcohol Use: No  . Drug Use: No  . Sexual Activity: Not Currently   Other Topics Concern  . Not on file   Social History Narrative   Recently moved with here with husband to [facility].   Daughter lives here and is my patient.   The PMH, [place], Social  History, Family History, Medications, and allergies have been reviewed in [facility], and have been updated if relevant.   Review of Systems  Constitutional: Positive for fatigue. Negative for fever.  HENT: Positive for congestion, postnasal drip, rhinorrhea and sore throat. Negative for ear discharge, ear pain, facial swelling, hearing loss, sinus pressure, sneezing, trouble swallowing and voice change.   Respiratory: Positive for cough. Negative for choking, chest tightness and wheezing.   Cardiovascular: Negative.   Gastrointestinal: Negative.   Genitourinary: Negative.   Musculoskeletal: Negative.   Skin: Negative.   Neurological: Negative.   Psychiatric/Behavioral: Negative.   All other systems reviewed and are negative.      Objective:    BP 156/92 mmHg  Pulse 101  Temp(Src) 98.1 F (36.7 C) (Oral)  Wt 107 lb 4 oz  (48.648 kg)  SpO2 96%   Physical Exam  Constitutional: She is oriented to person, place, and time. She appears well-developed and well-nourished. No distress.  HENT:  Head: Normocephalic and atraumatic.  Right Ear: Hearing and tympanic membrane normal.  Left Ear: Hearing and tympanic membrane normal.  Nose: Rhinorrhea present. No sinus tenderness. Right sinus exhibits no maxillary sinus tenderness and no frontal sinus tenderness. Left sinus exhibits no maxillary sinus tenderness and no frontal sinus tenderness.  Mouth/Throat: Uvula is midline and mucous membranes are normal. Posterior oropharyngeal erythema present. No oropharyngeal exudate.  Cardiovascular: Tachycardia present.   Pulmonary/Chest: Effort normal. No accessory muscle usage. Apnea noted. No tachypnea. No respiratory distress. She has decreased breath sounds in the right lower field. She has wheezes in the left upper field and the left middle field. She has rhonchi in the left middle field and the left lower field. She has no rales.  Musculoskeletal: Normal range of motion.  Neurological: She is alert and oriented to person, place, and time. No cranial nerve deficit.  Skin: Skin is warm and dry. No rash noted.  Psychiatric: She has a normal mood and affect. Her behavior is normal. Judgment and thought content normal.          Assessment & Plan:   B12 deficiency  Acute upper respiratory infection No Follow-up on file.

## 2014-12-16 NOTE — Assessment & Plan Note (Signed)
Deteriorated. Given that she is symptomatic- increased fatigue and LE tingling, IM B12 given today.

## 2014-12-16 NOTE — Progress Notes (Signed)
Pre visit review using our clinic review tool, if applicable. No additional management support is needed unless otherwise documented below in the visit note. 

## 2014-12-16 NOTE — Addendum Note (Signed)
Addended by: Modena Nunnery on: 12/16/2014 09:33 AM   Modules accepted: Orders

## 2014-12-16 NOTE — Patient Instructions (Signed)
Great to see you. I hope you are able to enjoy your holiday.  Take zpack as directed, ok to continue mucinex (drink plenty of water) for next few days, tessalon as needed for cough.  If your symptoms worsen over the weekend, please go straight to the ER.

## 2014-12-16 NOTE — Assessment & Plan Note (Addendum)
New- progressive. Symptoms and exam consistent with probable PNA, community acquired. Discussed treatment plan with pt and family- >25 minutes spent in face to face time with patient, >50% spent in counselling or coordination of care  Agreed to defer CXR as it will not change our management and she is schedule for routine PET/imaging for follow up CA in a couple of weeks- we would like to minimize her radiation exposure.  Treat with zpack. Also given tessalon perles three times daily prn cough- continue already scheduled hydrocodone as codeine does act as cough suppressant.  Call or return to clinic prn if these symptoms worsen or fail to improve as anticipated. The patient indicates understanding of these issues and agrees with the plan.  Prevnar 13 given today as well.

## 2014-12-31 ENCOUNTER — Other Ambulatory Visit: Payer: Self-pay | Admitting: Family Medicine

## 2014-12-31 MED ORDER — HYDROCODONE-ACETAMINOPHEN 5-325 MG PO TABS: 1.0000 | ORAL_TABLET | ORAL | Status: DC | PRN

## 2014-12-31 NOTE — Telephone Encounter (Signed)
Pt was seen 12/16/14 and has taken tessalon for cough; pt no longer has cough and wants to know if needs to continue taking tessalon. Advised pt tessalon is an as needed med for cough and if pt is not coughing pt does not have to take tessalon. Pt voiced understanding.

## 2014-12-31 NOTE — Telephone Encounter (Signed)
Spoke to pt and informed her Rx is available for pickup from the front desk

## 2014-12-31 NOTE — Telephone Encounter (Signed)
pts daughter left v/m requesting rx hydrocodone apap. Call when ready for pick up. Pt has 3 pills left. No future appt scheduled;not sure last f/u appt.

## 2015-01-11 ENCOUNTER — Other Ambulatory Visit: Payer: Self-pay | Admitting: Hematology

## 2015-01-11 ENCOUNTER — Ambulatory Visit (HOSPITAL_COMMUNITY)
Admission: RE | Admit: 2015-01-11 | Discharge: 2015-01-11 | Disposition: A | Discharge status: Home / Self Care | Payer: Medicare Other | Source: Ambulatory Visit | Attending: Hematology | Admitting: Hematology

## 2015-01-11 ENCOUNTER — Other Ambulatory Visit: Payer: Self-pay | Admitting: Hematology and Oncology

## 2015-01-11 ENCOUNTER — Telehealth: Payer: Self-pay | Admitting: Hematology and Oncology

## 2015-01-11 ENCOUNTER — Other Ambulatory Visit: Payer: Self-pay | Admitting: *Deleted

## 2015-01-11 DIAGNOSIS — C8591 Non-Hodgkin lymphoma, unspecified, lymph nodes of head, face, and neck: Secondary | ICD-10-CM

## 2015-01-11 DIAGNOSIS — J939 Pneumothorax, unspecified: Secondary | ICD-10-CM | POA: Diagnosis present

## 2015-01-11 DIAGNOSIS — J849 Interstitial pulmonary disease, unspecified: Secondary | ICD-10-CM | POA: Diagnosis not present

## 2015-01-11 DIAGNOSIS — C50919 Malignant neoplasm of unspecified site of unspecified female breast: Secondary | ICD-10-CM

## 2015-01-11 DIAGNOSIS — I517 Cardiomegaly: Secondary | ICD-10-CM

## 2015-01-11 DIAGNOSIS — J90 Pleural effusion, not elsewhere classified: Secondary | ICD-10-CM

## 2015-01-11 LAB — GLUCOSE, CAPILLARY: GLUCOSE-CAPILLARY: 141 mg/dL — AB (ref 70–99)

## 2015-01-11 MED ORDER — FLUDEOXYGLUCOSE F - 18 (FDG) INJECTION
5.7000 | Freq: Once | INTRAVENOUS | Status: AC | PRN
  Administered 2015-01-11: 5.7 via INTRAVENOUS

## 2015-01-11 NOTE — Telephone Encounter (Signed)
s.w pt and advised on xray....pt ok and aware

## 2015-01-12 ENCOUNTER — Ambulatory Visit (HOSPITAL_COMMUNITY)
Admission: RE | Admit: 2015-01-12 | Discharge: 2015-01-12 | Disposition: A | Discharge status: Home / Self Care | Payer: Medicare Other | Source: Ambulatory Visit | Attending: Hematology | Admitting: Hematology

## 2015-01-12 DIAGNOSIS — J90 Pleural effusion, not elsewhere classified: Secondary | ICD-10-CM

## 2015-01-12 DIAGNOSIS — J849 Interstitial pulmonary disease, unspecified: Secondary | ICD-10-CM | POA: Diagnosis not present

## 2015-01-18 ENCOUNTER — Other Ambulatory Visit: Payer: Self-pay | Admitting: *Deleted

## 2015-01-18 DIAGNOSIS — C140 Malignant neoplasm of pharynx, unspecified: Secondary | ICD-10-CM

## 2015-01-18 DIAGNOSIS — C50919 Malignant neoplasm of unspecified site of unspecified female breast: Secondary | ICD-10-CM

## 2015-01-19 ENCOUNTER — Other Ambulatory Visit: Payer: Self-pay | Admitting: Hematology and Oncology

## 2015-01-19 ENCOUNTER — Telehealth: Payer: Self-pay | Admitting: Hematology and Oncology

## 2015-01-19 ENCOUNTER — Ambulatory Visit (HOSPITAL_BASED_OUTPATIENT_CLINIC_OR_DEPARTMENT_OTHER): Payer: Medicare Other | Admitting: Hematology and Oncology

## 2015-01-19 ENCOUNTER — Other Ambulatory Visit (HOSPITAL_BASED_OUTPATIENT_CLINIC_OR_DEPARTMENT_OTHER): Payer: Medicare Other

## 2015-01-19 VITALS — BP 142/87 | HR 96 | Temp 97.6°F | Resp 18 | Ht 65.0 in | Wt 104.6 lb

## 2015-01-19 DIAGNOSIS — C140 Malignant neoplasm of pharynx, unspecified: Secondary | ICD-10-CM

## 2015-01-19 DIAGNOSIS — C50911 Malignant neoplasm of unspecified site of right female breast: Secondary | ICD-10-CM

## 2015-01-19 DIAGNOSIS — C50912 Malignant neoplasm of unspecified site of left female breast: Secondary | ICD-10-CM

## 2015-01-19 DIAGNOSIS — Z85819 Personal history of malignant neoplasm of unspecified site of lip, oral cavity, and pharynx: Secondary | ICD-10-CM

## 2015-01-19 DIAGNOSIS — R918 Other nonspecific abnormal finding of lung field: Secondary | ICD-10-CM

## 2015-01-19 DIAGNOSIS — M810 Age-related osteoporosis without current pathological fracture: Secondary | ICD-10-CM

## 2015-01-19 DIAGNOSIS — C50919 Malignant neoplasm of unspecified site of unspecified female breast: Secondary | ICD-10-CM

## 2015-01-19 LAB — CBC WITH DIFFERENTIAL/PLATELET
BASO%: 0.3 % (ref 0.0–2.0)
Basophils Absolute: 0 10*3/uL (ref 0.0–0.1)
EOS%: 2.1 % (ref 0.0–7.0)
Eosinophils Absolute: 0.2 10*3/uL (ref 0.0–0.5)
HEMATOCRIT: 40.1 % (ref 34.8–46.6)
HGB: 12.9 g/dL (ref 11.6–15.9)
LYMPH#: 1.2 10*3/uL (ref 0.9–3.3)
LYMPH%: 13.9 % — AB (ref 14.0–49.7)
MCH: 30.9 pg (ref 25.1–34.0)
MCHC: 32.2 g/dL (ref 31.5–36.0)
MCV: 95.9 fL (ref 79.5–101.0)
MONO#: 0.8 10*3/uL (ref 0.1–0.9)
MONO%: 8.5 % (ref 0.0–14.0)
NEUT#: 6.7 10*3/uL — ABNORMAL HIGH (ref 1.5–6.5)
NEUT%: 75.2 % (ref 38.4–76.8)
PLATELETS: 200 10*3/uL (ref 145–400)
RBC: 4.18 10*6/uL (ref 3.70–5.45)
RDW: 14.2 % (ref 11.2–14.5)
WBC: 8.9 10*3/uL (ref 3.9–10.3)

## 2015-01-19 LAB — COMPREHENSIVE METABOLIC PANEL (CC13)
ALT: 19 U/L (ref 0–55)
AST: 27 U/L (ref 5–34)
Albumin: 3.6 g/dL (ref 3.5–5.0)
Alkaline Phosphatase: 74 U/L (ref 40–150)
Anion Gap: 11 mEq/L (ref 3–11)
BUN: 16.6 mg/dL (ref 7.0–26.0)
CALCIUM: 9.3 mg/dL (ref 8.4–10.4)
CHLORIDE: 101 meq/L (ref 98–109)
CO2: 28 mEq/L (ref 22–29)
Creatinine: 0.8 mg/dL (ref 0.6–1.1)
EGFR: 68 mL/min/{1.73_m2} — ABNORMAL LOW (ref >90)
GLUCOSE: 101 mg/dL (ref 70–140)
Potassium: 4.3 mEq/L (ref 3.5–5.1)
Sodium: 139 mEq/L (ref 136–145)
Total Bilirubin: 0.58 mg/dL (ref 0.20–1.20)
Total Protein: 7.6 g/dL (ref 6.4–8.3)

## 2015-01-19 NOTE — Assessment & Plan Note (Signed)
Right breast cancer status post mastectomy ER/PR HER-2 negative followed by observation T1 cN0 M0 stage IA Breast cancer surveillance: Physical examinations and annual follow-ups  L1 compression fracture: Most likely secondary to osteoporosis. Currently on Fosamax once a week with calcium and vitamin D.

## 2015-01-19 NOTE — Progress Notes (Signed)
Patient Care Team: Lucille Passy, MD as PCP - General (Family Medicine)  DIAGNOSIS: No matching staging information was found for the patient.  SUMMARY OF ONCOLOGIC HISTORY:   Breast cancer, left breast   08/26/2009 Surgery Left breast mastectomy: well-differentiated infiltrating ductal carcinoma 2 cm grade 3 with negative nodes ER positive PR positive HER-2/neu negative( stage II) Oncotype DX recurrence score 41 status post chemotherapy   10/29/2009 - 01/03/2010 Chemotherapy Taxotere and Cytoxan 4 cycles   02/08/2010 -  Anti-estrogen oral therapy Arimidex 1 mg daily later changed to Letrozole 2.5 mg daily   03/30/2011 PET scan Lung nodules treated with radiosurgery in [place] on 06/26/2011 repeat PET/CT showed resolution, biopsy showed undifferentiated carcinoma.    Breast cancer, right breast   04/11/2012 Surgery Right breast mastectomy: 1.2 cm infiltrating ductal carcinoma with desmoplastic response ER negative PR negative HER-2/neu negative March 2013 (stage I)   12/02/2012 Surgery L1 compression fracture underwent kyphoplasty    CHIEF COMPLIANT: Follow-up after PET/CT scan  INTERVAL HISTORY: Veronica Pierce is a 81 year old lady with above-mentioned history of bilateral breast cancers as well as throat cancer who is currently followed by [place] for both of these cancers. She recently had undergone a PET CT scan on January 18. On the PET CT she had a small pneumothorax which was then further evaluated by chest x-ray the following day and it was felt to be stable. Upon review of her old PET/CT scans, it appears that she has had chronic small pneumothorax in the right lung. There appears to be diffuse interstitial lung disease changes in both lungs worse on the right side.  Patient has been otherwise doing fairly well except for dryness of the mouth and occasional cough with expectoration. Denies any fevers or chills or weight loss. Denies any pain in the lungs. She does have profound shortness of breath  or exertion.  REVIEW OF SYSTEMS:   Constitutional: Denies fevers, chills or abnormal weight loss Eyes: Denies blurriness of vision Ears, nose, mouth, throat, and face: Denies mucositis or sore throat Respiratory: Cough and shortness of breath or exertion Cardiovascular: Denies palpitation, chest discomfort or lower extremity swelling Gastrointestinal:  Denies nausea, heartburn or change in bowel habits Skin: Denies abnormal skin rashes Lymphatics: Denies new lymphadenopathy or easy bruising Neurological:Denies numbness, tingling or new weaknesses Behavioral/Psych: Mood is stable, no new changes  All other systems were reviewed with the patient and are negative.  I have reviewed the past medical history, past surgical history, social history and family history with the patient and they are unchanged from previous note.  ALLERGIES:  has No Known Allergies.  MEDICATIONS:  Current Outpatient Prescriptions  Medication Sig Dispense Refill  . alendronate (FOSAMAX) 35 MG tablet TAKE 1 TABLET BY MOUTH EVERY 7 DAYS. TAKE WITH A FULL GLASS OF WATER ON AN EMPTY STOMACH 12 tablet 0  . calcium gluconate 500 MG tablet Take 1,000 mg by mouth daily.    . cyanocobalamin (,VITAMIN B-12,) 1000 MCG/ML injection Inject 1,000 mcg into the muscle every 30 (thirty) days.    Marland Kitchen HYDROcodone-acetaminophen (NORCO/VICODIN) 5-325 MG per tablet Take 1-2 tablets by mouth every 4 (four) hours as needed for moderate pain. 60 tablet 0  . letrozole (FEMARA) 2.5 MG tablet TAKE 1 TABLET (2.5 MG TOTAL) BY MOUTH DAILY. 90 tablet 3  . levothyroxine (SYNTHROID, LEVOTHROID) 100 MCG tablet Take 1 tablet (100 mcg total) by mouth daily before breakfast. 30 tablet 2  . pilocarpine (SALAGEN) 5 MG tablet Take 5 mg by  mouth every 12 (twelve) hours.    . Pseudoephedrine-Guaifenesin (MUCINEX D PO) Take 1 tablet by mouth 2 (two) times daily.     No current facility-administered medications for this visit.    PHYSICAL EXAMINATION: ECOG  PERFORMANCE STATUS: 1 - Symptomatic but completely ambulatory  Filed Vitals:   01/19/15 1042  BP: 142/87  Pulse: 96  Temp: 97.6 F (36.4 C)  Resp: 18   Filed Weights   01/19/15 1042  Weight: 104 lb 9.6 oz (47.446 kg)    GENERAL:alert, no distress and comfortable SKIN: skin color, texture, turgor are normal, no rashes or significant lesions EYES: normal, Conjunctiva are pink and non-injected, sclera clear OROPHARYNX:no exudate, no erythema and lips, buccal mucosa, and tongue normal  NECK: supple, thyroid normal size, non-tender, without nodularity LYMPH:  no palpable lymphadenopathy in the cervical, axillary or inguinal LUNGS: Fine crackles in the right lung HEART: regular rate & rhythm and no murmurs and no lower extremity edema ABDOMEN:abdomen soft, non-tender and normal bowel sounds Musculoskeletal:no cyanosis of digits and no clubbing  NEURO: alert & oriented x 3 with fluent speech, no focal motor/sensory deficits LABORATORY DATA:  I have reviewed the data as listed   Chemistry      Component Value Date/Time   NA 139 01/19/2015 1032   NA 143 01/30/2014 0724   K 4.3 01/19/2015 1032   K 3.8 01/30/2014 0724   CL 108 01/30/2014 0724   CO2 28 01/19/2015 1032   CO2 24 01/30/2014 0724   BUN 16.6 01/19/2015 1032   BUN 14 01/30/2014 0724   CREATININE 0.8 01/19/2015 1032   CREATININE 0.54 01/30/2014 0724      Component Value Date/Time   CALCIUM 9.3 01/19/2015 1032   CALCIUM 8.2* 01/30/2014 0724   ALKPHOS 74 01/19/2015 1032   ALKPHOS 74 01/30/2014 0724   AST 27 01/19/2015 1032   AST 34 01/30/2014 0724   ALT 19 01/19/2015 1032   ALT 51* 01/30/2014 0724   BILITOT 0.58 01/19/2015 1032   BILITOT 0.5 01/30/2014 0724       Lab Results  Component Value Date   WBC 8.9 01/19/2015   HGB 12.9 01/19/2015   HCT 40.1 01/19/2015   MCV 95.9 01/19/2015   PLT 200 01/19/2015   NEUTROABS 6.7* 01/19/2015   ASSESSMENT & PLAN:  Breast cancer, left breast Left breast invasive  ductal carcinoma: ER/PR positive HER-2 negative status post mastectomy followed by adjuvant chemotherapy and is currently on oral antiestrogen therapy with letrozole 2.5 mg daily since 2011.  Letrozole toxicities: Osteoporosis with osteoporotic compression fractures currently on bisphosphonates Recommendation: Complete 5 years of therapy.  Breast cancer surveillance: Physical examination of the chest for axilla does not reveal any lumps or nodules. Continue to be seen annually for physical examination and follow-up.  Breast cancer, right breast Right breast cancer status post mastectomy ER/PR HER-2 negative followed by observation T1 cN0 M0 stage IA Breast cancer surveillance: Physical examinations and annual follow-ups  L1 compression fracture: Most likely secondary to osteoporosis. Currently on Fosamax once a week with calcium and vitamin D.  PET/CT scan 01/11/2015: Bilateral lung nodules, hypermetabolic worse in the right lung, left axillary lymph node hypermetabolic, spleen and adnexal hypermetabolic activity concerning for metastatic disease. I reviewed the images with the patient and her family and I discussed different options. Patient would like to know what these nodules are before deciding on whether or not she wants to consider treatment. I will request interventional radiology to review her images to biopsy  the lung nodule. The other option would be to biopsy the left axillary lymph node.  Adnexal mass: Patient's daughter sees Dr. Paula Compton with gynecology. I would like to refer her to Dr. Marvel Plan to evaluate the adnexal lesion.  Return to clinic after these biopsies to discuss treatment options No orders of the defined types were placed in this encounter.   The patient has a good understanding of the overall plan. she agrees with it. She will call with any problems that may develop before her next visit here.   Rulon Eisenmenger, MD

## 2015-01-19 NOTE — Assessment & Plan Note (Signed)
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 Letrozole toxicities: Osteoporosis with osteoporotic compression fractures currently on bisphosphonates Recommendation: Complete 5 years of therapy and discontinued treatment  Breast cancer surveillance: Physical examination of the chest for axilla does not reveal any lumps or nodules.  Continue to be seen annually for physical examination and follow-up.

## 2015-01-19 NOTE — Telephone Encounter (Signed)
, °

## 2015-01-21 ENCOUNTER — Other Ambulatory Visit: Payer: Self-pay | Admitting: Radiology

## 2015-01-25 ENCOUNTER — Telehealth: Payer: Self-pay | Admitting: Hematology and Oncology

## 2015-01-25 ENCOUNTER — Telehealth: Payer: Self-pay | Admitting: Oncology

## 2015-01-25 ENCOUNTER — Telehealth: Payer: Self-pay | Admitting: *Deleted

## 2015-01-25 DIAGNOSIS — R1031 Right lower quadrant pain: Secondary | ICD-10-CM

## 2015-01-25 DIAGNOSIS — R948 Abnormal results of function studies of other organs and systems: Secondary | ICD-10-CM

## 2015-01-25 NOTE — Telephone Encounter (Signed)
Pt's daughter - Jackelyn Poling - called stating per visit last week with Dr Yvette Rack - referral was to be made to Dr Paula Compton due to " hot spot on ovary per PET scan ".  " we haven't heard anything yet about the appointment "  Debbie requested a return call to her at [phone].  REFERRAL PLACED AND POF SENT TO SCHEDULING PER ABOVE AND NOTED IN MD DICTATION.  REQUEST SENT TO HIM FOR RECORDS TO BE FAXED.  THIS NOTE WILL BE SENT TO MD AND RN AT DESK FOR COMMUNICATION ONLY.

## 2015-01-25 NOTE — Telephone Encounter (Signed)
per pof to sch pt appt w/Dr Richardson-cld and they req demo-office notes-sent referral to Charlann Boxer to fax info to [phone] Attn Hannah-office stated that they would call pt to set appt after info recieived & reviewd

## 2015-01-25 NOTE — Telephone Encounter (Signed)
FAXED PT [facility] [place]

## 2015-01-26 ENCOUNTER — Other Ambulatory Visit: Payer: Self-pay | Admitting: Radiology

## 2015-01-27 ENCOUNTER — Other Ambulatory Visit: Payer: Self-pay | Admitting: *Deleted

## 2015-01-27 ENCOUNTER — Encounter (HOSPITAL_COMMUNITY): Payer: Self-pay

## 2015-01-27 ENCOUNTER — Ambulatory Visit (HOSPITAL_COMMUNITY)
Admission: RE | Admit: 2015-01-27 | Discharge: 2015-01-27 | Disposition: A | Discharge status: Home / Self Care | Payer: Medicare Other | Source: Ambulatory Visit | Attending: Hematology and Oncology | Admitting: Hematology and Oncology

## 2015-01-27 ENCOUNTER — Other Ambulatory Visit: Payer: Self-pay

## 2015-01-27 ENCOUNTER — Telehealth: Payer: Self-pay | Admitting: *Deleted

## 2015-01-27 DIAGNOSIS — C140 Malignant neoplasm of pharynx, unspecified: Secondary | ICD-10-CM | POA: Diagnosis not present

## 2015-01-27 DIAGNOSIS — C50912 Malignant neoplasm of unspecified site of left female breast: Secondary | ICD-10-CM | POA: Insufficient documentation

## 2015-01-27 LAB — CBC
HEMATOCRIT: 39.5 % (ref 36.0–46.0)
HEMOGLOBIN: 12.5 g/dL (ref 12.0–15.0)
MCH: 30.3 pg (ref 26.0–34.0)
MCHC: 31.6 g/dL (ref 30.0–36.0)
MCV: 95.6 fL (ref 78.0–100.0)
Platelets: 185 10*3/uL (ref 150–400)
RBC: 4.13 MIL/uL (ref 3.87–5.11)
RDW: 14.1 % (ref 11.5–15.5)
WBC: 6.8 10*3/uL (ref 4.0–10.5)

## 2015-01-27 LAB — APTT: aPTT: 28 seconds (ref 24–37)

## 2015-01-27 LAB — PROTIME-INR
INR: 1.14 (ref 0.00–1.49)
Prothrombin Time: 14.8 seconds (ref 11.6–15.2)

## 2015-01-27 MED ORDER — FENTANYL CITRATE 0.05 MG/ML IJ SOLN
INTRAMUSCULAR | Status: AC
  Filled 2015-01-27: qty 4

## 2015-01-27 MED ORDER — HYDROCODONE-ACETAMINOPHEN 5-325 MG PO TABS: 1.0000 | ORAL_TABLET | ORAL | Status: DC | PRN

## 2015-01-27 MED ORDER — FENTANYL CITRATE 0.05 MG/ML IJ SOLN
INTRAMUSCULAR | Status: AC | PRN
  Administered 2015-01-27: 25 ug via INTRAVENOUS

## 2015-01-27 MED ORDER — SODIUM CHLORIDE 0.9 % IV SOLN
INTRAVENOUS | Status: DC
  Administered 2015-01-27: 500 mL via INTRAVENOUS

## 2015-01-27 MED ORDER — MIDAZOLAM HCL 2 MG/2ML IJ SOLN
INTRAMUSCULAR | Status: AC | PRN
  Administered 2015-01-27 (×3): 0.5 mg via INTRAVENOUS

## 2015-01-27 MED ORDER — MIDAZOLAM HCL 2 MG/2ML IJ SOLN
INTRAMUSCULAR | Status: AC
  Filled 2015-01-27: qty 6

## 2015-01-27 NOTE — Telephone Encounter (Signed)
Pt's daughter Jackelyn Poling left v/m requesting rx hydrocodone apap. Call when ready for pick up. Pt last seen 12/16/14; last rx printed 12/31/14. Pt has 9 pills left at this time but pts husband will be at [facility] on 01/28/15 and wants to know if could pick up rx at that time. Jackelyn Poling also wants to know if pt needs to have f/u lab testing re: vit b 12 level. Debbie request cb. DPR signed to speak with Debbie.

## 2015-01-27 NOTE — Addendum Note (Signed)
Addended by: Modena Nunnery on: 01/27/2015 04:07 PM   Modules accepted: Orders

## 2015-01-27 NOTE — H&P (Signed)
Chief Complaint: L axillary lymph node enlargement +PET  Referring Physician(s): Gudena,Vinay K  History of Present Illness: Veronica Pierce is a 81 y.o. female   Pt with Hx B Breast Ca 2010 Throat Ca 2010 Lung Ca 2012- radiosurgery in [place] Followed by Dr Lindi Adie Recent PET reveals + B Lung nodules; spleen lesion; L axillary LAN and possible L adnexal cyst/mass And R pleural effusion with small pneumothrax Request made for biopsy to evaluate plan for treatment Now scheduled for L Ax LAN bx   Past Medical History  Diagnosis Date  . Throat cancer 2010  . Breast cancer 2010    left   . Lung cancer 2012  . Breast cancer 2013    right  . Hypothyroidism   . HTN (hypertension)     Past Surgical History  Procedure Laterality Date  . Mastectomy Bilateral     2010, 2013  . Mastectomy  bilateral  . Back surgery    . Portacath placement    . Port-a-cath removal    . Cholecystectomy N/A 01/28/2014    Procedure: LAPAROSCOPIC CHOLECYSTECTOMY;  Surgeon: Rolm Bookbinder, MD;  Location: [place];  Service: General;  Laterality: N/A;    Allergies: Review of patient's allergies indicates no known allergies.  Medications: Prior to Admission medications   Medication Sig Start Date End Date Taking? Authorizing Provider  alendronate (FOSAMAX) 35 MG tablet TAKE 1 TABLET BY MOUTH EVERY 7 DAYS. TAKE WITH A FULL GLASS OF WATER ON AN EMPTY STOMACH 01/11/15  Yes Rulon Eisenmenger, MD  calcium gluconate 500 MG tablet Take 1,000 mg by mouth daily.   Yes Historical Provider, MD  Cyanocobalamin (VITAMIN B 12 PO) Take 1 tablet by mouth daily.   Yes Historical Provider, MD  guaifenesin (HUMIBID E) 400 MG TABS tablet Take 400 mg by mouth daily as needed (congestion.).   Yes Historical Provider, MD  HYDROcodone-acetaminophen (NORCO/VICODIN) 5-325 MG per tablet Take 1-2 tablets by mouth every 4 (four) hours as needed for moderate pain. 12/31/14  Yes Lucille Passy, MD  letrozole (FEMARA) 2.5 MG tablet TAKE  1 TABLET (2.5 MG TOTAL) BY MOUTH DAILY. 10/20/14  Yes Rulon Eisenmenger, MD  levothyroxine (SYNTHROID, LEVOTHROID) 100 MCG tablet Take 1 tablet (100 mcg total) by mouth daily before breakfast. 12/11/14  Yes Lucille Passy, MD  pilocarpine (SALAGEN) 5 MG tablet Take 5 mg by mouth every 12 (twelve) hours.   Yes Historical Provider, MD    Family History  Problem Relation Age of Onset  . Diabetes Mother   . Arthritis Father     History   Social History  . Marital Status: Married    Spouse Name: N/A    Number of Children: N/A  . Years of Education: N/A   Social History Main Topics  . Smoking status: Never Smoker   . Smokeless tobacco: Never Used  . Alcohol Use: No  . Drug Use: No  . Sexual Activity: Not Currently   Other Topics Concern  . None   Social History Narrative   Recently moved with here with husband to [facility].   Daughter lives here and is my patient.     Review of Systems: A 12 point ROS discussed and pertinent positives are indicated in the HPI above.  All other systems are negative.  Review of Systems  Constitutional: Positive for appetite change and fatigue. Negative for fever and activity change.  Respiratory: Positive for shortness of breath. Negative for chest tightness.   Cardiovascular:  Negative for chest pain.  Gastrointestinal: Positive for nausea. Negative for abdominal pain.  Musculoskeletal: Positive for back pain.  Neurological: Positive for weakness.  Psychiatric/Behavioral: Negative for behavioral problems and confusion.    Vital Signs: BP 136/63 mmHg  Pulse 89  Temp(Src) 97.8 F (36.6 C) (Oral)  Resp 18  SpO2 100%  Physical Exam  Constitutional: She is oriented to person, place, and time.  Thin/frail  Cardiovascular: Normal rate, regular rhythm and normal heart sounds.   No murmur heard. Pulmonary/Chest: Effort normal and breath sounds normal. She has no wheezes.  Abdominal: Soft. Bowel sounds are normal. There is no tenderness.    Musculoskeletal: Normal range of motion.  Neurological: She is alert and oriented to person, place, and time.  Skin: Skin is warm and dry.  Psychiatric: She has a normal mood and affect. Her behavior is normal. Judgment and thought content normal.  Nursing note and vitals reviewed.   Imaging: Dg Chest 2 View  01/12/2015   CLINICAL DATA:  Pneumothorax.  EXAM: CHEST  2 VIEW  COMPARISON:  PET-CT 01/11/2015. Chest x-ray 01/28/2014. CT 01/27/2014.  FINDINGS: Mediastinum and hilar structures are stable. Stable cardiomegaly. Stable dense bilateral pulmonary interstitial lung disease. Active pneumonitis cannot be excluded. Reference is made to PET-CT report for discussion PET positive pulmonary nodular lesions. Known right hydro pneumothorax again noted. No other change. Prior vertebroplasty. Surgical clips right upper quadrant. Surgical clips left axilla.  IMPRESSION: 1. Known small right hydronephrotic thorax unchanged. 2. Severe pulmonary interstitial lung disease, most likely chronic. Findings most severe in the right upper lung. Active pneumonitis cannot be excluded. Reference is made to recent PET-CT for discussion of PET positive pulmonary lesions. 3. Stable cardiomegaly.   Electronically Signed   By: Marcello Moores  Register   On: 01/12/2015 14:16   Nm Pet Image Restag (ps) Skull Base To Thigh  01/11/2015   CLINICAL DATA:  Subsequent treatment strategy for head and neck cancer and bilateral breast cancer.  EXAM: NUCLEAR MEDICINE PET SKULL BASE TO THIGH  TECHNIQUE: 5.7 mCi F-18 FDG was injected intravenously. Full-ring PET imaging was performed from the skull base to thigh after the radiotracer. CT data was obtained and used for attenuation correction and anatomic localization.  FASTING BLOOD GLUCOSE:  Value: 141 mg/dl  COMPARISON:  CT of the chest, abdomen and pelvis 01/27/2014.  FINDINGS: NECK  No hypermetabolic soft tissue mass or lymph nodes in the neck.  CHEST  Small pneumothorax in the anterior aspect of  the right hemithorax. There are multiple nodular areas of hypermetabolism in the lungs bilaterally, the majority of which cannot be discretely measured as they appear incorporated within areas of fibrosis and scarring in the right lung. The largest discretely measurable nodule is in the periphery of the left upper lobe, and has a subsolid appearance measuring up to 1.9 x 1.9 cm (image 33 of series 6), demonstrating hypermetabolism (SUVmax = 4.9). There is also a pleural-based sub solid nodule in the medial aspect of the left lower lobe (image 54 of series 6) which measures 1.8 x 1.5 cm (SUVmax = 5.4). Additional areas of hypermetabolism noted in the right lung include a right suprahilar lesion (SUVmax = 4.4), an area in the periphery of the right upper lobe (SUVmax = 4.8), and an area in the right middle lobe (SUVmax = 6.6). Throughout the periphery of he left lung, particularly in the upper lung there is extensive subpleural reticulation and ground-glass attenuation. In the right lung, there is extensive cylindrical and varicose  bronchiectasis with extensive thickening of the peribronchovascular interstitium and patchy reticular opacities, compatible with areas of fibrosis. This is most severe in the mid to upper old right lung. Moderate right pleural effusion, similar to the prior examination. No hypermetabolic mediastinal or hilar lymphadenopathy noted. There is atherosclerosis of the thoracic aorta, the great vessels of the mediastinum and the coronary arteries, including calcified atherosclerotic plaque in the left anterior descending and right coronary arteries. Status post bilateral modified radical mastectomy and left axillary nodal dissection. 1 cm short axis left axillary lymph node demonstrates low-level hypermetabolism (SUVmax = 2.4).  ABDOMEN/PELVIS  New 2.6 x 4.5 cm low-attenuation lesion in the superior aspect of the spleen which has heterogeneous internal hypermetabolism (SUVmax = 3.5). No abnormal  hypermetabolic activity within the liver, pancreas, or spleen. No hypermetabolic lymph nodes in the abdomen or pelvis. Status post cholecystectomy. Extensive atherosclerosis throughout the abdominal and pelvic vasculature. In the left adnexa there is a well-defined 3.9 x 3.1 cm low-attenuation lesion, likely an ovarian cyst, which demonstrates no internal hypermetabolism. Partially calcified 2.0 x 2.5 cm non hypermetabolic lesion extending exophytically from the posterior aspect of the uterine fundus on the right side, likely a fibroid.  SKELETON  No focal hypermetabolic activity to suggest skeletal metastasis.  IMPRESSION: 1. Right-sided hydropneumothorax, with small pneumothorax component, which is new compared to the prior examination, presumably spontaneous. Clinical correlation is recommended. 2. Multiple nodular areas of hypermetabolism in the lungs bilaterally. The lesions in the right lung are difficult to discretely measure, as they appear incorporated within pre-existing areas of fibrosis. These could be malignant, or could be benign physiologic areas of inflammation. Two sub solid nodules in the left lung are also nonspecific in appearance, but could be targeted for biopsy if clinically appropriate. 3. In addition, there is a new large low-attenuation lesion in the superior aspect of the spleen, which demonstrates heterogeneous hypermetabolism, concerning for a metastatic lesion. 4. Status post bilateral modified radical mastectomy and left axillary nodal dissection, with mildly enlarged left axillary lymph node measuring 1 cm which demonstrates low-level hypermetabolic activity (SUVmax = 2.4), concerning for nodal disease. 5. 3.9 x 3.1 cm cystic lesion in the left adnexa likely to represent an ovarian cyst. This is unusual in a postmenopausal female, and followup evaluation with pelvic ultrasound is recommended to ensure the benign nature of this finding. This recommendation follows ACR consensus  guidelines: White Paper of the ACR Incidental Findings Committee II on Adnexal Findings. J Am Coll Radiol 2013:10:675-681. 6. Additional findings, as above. Critical Value/emergent results were called by telephone at the time of interpretation on 01/11/2015 at 11:59 am to Dr. Lindi Adie, who verbally acknowledged these results.   Electronically Signed   By: Vinnie Langton M.D.   On: 01/11/2015 12:02    Labs:  CBC:  Recent Labs  01/28/14 0300 01/30/14 0448 07/06/14 1230 01/19/15 1032  WBC 13.0* 6.2 8.0 8.9  HGB 13.5 11.2* 13.8 12.9  HCT 39.6 33.3* 42.6 40.1  PLT 131* 105* 186 200    COAGS:  Recent Labs  01/28/14 0300  INR 1.20    BMP:  Recent Labs  01/27/14 1409 01/28/14 0300 01/30/14 0724 07/06/14 1230 01/19/15 1032  NA  --  141 143 140 139  K  --  3.9 3.8 4.5 4.3  CL  --  103 108  --   --   CO2  --  24 24 27 28   GLUCOSE  --  142* 111* 110 101  BUN  --  15 14 19.4 16.6  CALCIUM  --  8.7 8.2* 10.1 9.3  CREATININE 0.65 0.62 0.54 0.9 0.8  GFRNONAA 82* 83* 87*  --   --   GFRAA >90 >90 >90  --   --     LIVER FUNCTION TESTS:  Recent Labs  01/28/14 0300 01/30/14 0724 07/06/14 1230 01/19/15 1032  BILITOT 0.9 0.5 0.43 0.58  AST 48* 34 22 27  ALT 36* 51* 16 19  ALKPHOS 68 74 66 74  PROT 7.0 6.4 7.8 7.6  ALBUMIN 3.3* 2.6* 3.7 3.6    TUMOR MARKERS: No results for input(s): AFPTM, CEA, CA199, CHROMGRNA in the last 8760 hours.  Assessment and Plan:  Hx B breast ca; throat ca; lung ca +PET Jan 2016 reveals B lung nodules; spleen lesion; L Ax LAN Scheduled for L axillary lymph node bx before treatment planned Pt and family aware of procedure benefits and risks including but not limited to: Infection; bleeding; vessel damage Agreeable to proceed Consent signed andin chart   Thank you for this interesting consult.  I greatly enjoyed meeting Veronica Pierce and look forward to participating in their care.  Signed: Tyreece Gelles A 01/27/2015, 11:58 AM   I spent  a total of 20 minutes face to face in clinical consultation, greater than 50% of which was counseling/coordinating care for L Ax LN bx

## 2015-01-27 NOTE — Telephone Encounter (Signed)
Spoke to pt and advised per Dr Deborra Medina; informed her Rx is available for pickup from the front desk

## 2015-01-27 NOTE — Telephone Encounter (Signed)
Rx printed. She is not yet due to recheck B12- insurance will only allow [place] to check this a limited number of times per year.

## 2015-01-27 NOTE — Telephone Encounter (Signed)
Received call from Raymond with Dr. Marvel Plan of [facility]. Wanted to know the plan for patient, if she was going to have lung nodules biopsied first or proceed with the adnexal mass. Spoke with Dr. Lindi Adie and returned call to [facility]. Left message that patient was too frail to have lung nodules biopsied. Patient is having lymph node biopsy done today at [facility]. Informed that Dr. Marvel Plan can call Dr. Lindi Adie directly on his cell phone with questions.

## 2015-01-27 NOTE — Procedures (Signed)
Interventional Radiology Procedure Note  Procedure: US guided  core biopsy of left axillary lymph node.  PET positive Complications: None Recommendations: Observe for 1 hour Follow up biopsy result  Signed,  Dulcy Fanny. Earleen Newport, DO

## 2015-01-27 NOTE — Discharge Instructions (Signed)
Needle Biopsy °Care After °These instructions give you information on caring for yourself after your procedure. Your doctor may also give you more specific instructions. Call your doctor if you have any problems or questions after your procedure. °HOME CARE °· Rest for 4 hours after your biopsy, except for getting up to go to the bathroom or as told. °· Keep the places where the needles were put in clean and dry. °¨ Do not put powder or lotion on the sites. °¨ Do not shower until 24 hours after the test. Remove all bandages (dressings) before showering. °¨ Remove all bandages at least once every day. Gently clean the sites with soap and water. Keep putting a new bandage on until the skin is closed. °Finding out the results of your test °Ask your doctor when your test results will be ready. Make sure you follow up and get the test results. °GET HELP RIGHT AWAY IF:  °· You have shortness of breath or trouble breathing. °· You have pain or cramping in your belly (abdomen). °· You feel sick to your stomach (nauseous) or throw up (vomit). °· Any of the places where the needles were put in: °¨ Are puffy (swollen) or red. °¨ Are sore or hot to the touch. °¨ Are draining yellowish-white fluid (pus). °¨ Are bleeding after 10 minutes of pressing down on the site. Have someone keep pressing on any place that is bleeding until you see a doctor. °· You have any unusual pain that will not stop. °· You have a fever. °If you go to the emergency room, tell the nurse that you had a biopsy. Take this paper with you to show the nurse. °MAKE SURE YOU:  °· Understand these instructions. °· Will watch your condition. °· Will get help right away if you are not doing well or get worse. °Document Released: 11/23/2008 Document Revised: 03/04/2012 Document Reviewed: 11/23/2008 °[employer]® Patient Information ©2015 [employer]. This information is not intended to replace advice given to you by your health care provider. Make sure you discuss  any questions you have with your health care provider. °Conscious Sedation °Sedation is the use of medicines to promote relaxation and relieve discomfort and anxiety. Conscious sedation is a type of sedation. Under conscious sedation you are less alert than normal but are still able to respond to instructions or stimulation. Conscious sedation is used during short medical and dental procedures. It is milder than deep sedation or general anesthesia and allows you to return to your regular activities sooner.  °LET YOUR HEALTH CARE PROVIDER KNOW ABOUT:  °· Any allergies you have. °· All medicines you are taking, including vitamins, herbs, eye drops, creams, and over-the-counter medicines. °· Use of steroids (by mouth or creams). °· Previous problems you or members of your family have had with the use of anesthetics. °· Any blood disorders you have. °· Previous surgeries you have had. °· Medical conditions you have. °· Possibility of pregnancy, if this applies. °· Use of cigarettes, alcohol, or illegal drugs. °RISKS AND COMPLICATIONS °Generally, this is a safe procedure. However, as with any procedure, problems can occur. Possible problems include: °· Oversedation. °· Trouble breathing on your own. You may need to have a breathing tube until you are awake and breathing on your own. °· Allergic reaction to any of the medicines used for the procedure. °BEFORE THE PROCEDURE °· You may have blood tests done. These tests can help show how well your kidneys and liver are working. They can also   show how well your blood clots.  A physical exam will be done.  Only take medicines as directed by your health care provider. You may need to stop taking medicines (such as blood thinners, aspirin, or nonsteroidal anti-inflammatory drugs) before the procedure.   Do not eat or drink at least 6 hours before the procedure or as directed by your health care provider.  Arrange for a responsible adult, family member, or friend to  take you home after the procedure. He or she should stay with you for at least 24 hours after the procedure, until the medicine has worn off. PROCEDURE   An intravenous (IV) catheter will be inserted into one of your veins. Medicine will be able to flow directly into your body through this catheter. You may be given medicine through this tube to help prevent pain and help you relax.  The medical or dental procedure will be done. AFTER THE PROCEDURE  You will stay in a recovery area until the medicine has worn off. Your blood pressure and pulse will be checked.   Depending on the procedure you had, you may be allowed to go home when you can tolerate liquids and your pain is under control. Document Released: 09/05/2001 Document Revised: 12/16/2013 Document Reviewed: 08/18/2013 [facility] Patient Information 2015 [place], [place]. This information is not intended to replace advice given to you by your health care provider. Make sure you discuss any questions you have with your health care provider.  DO NOT DRIVE OR OPERATE ANY MACHINERY FOR 24 HOURS.  DO NOT USE ALCOHOL FOR 24 HOURS, DO NOT MAKE ANY LEGAL/IMPORTANT DECISIONS FOR 24 HOURS

## 2015-02-02 ENCOUNTER — Telehealth: Payer: Self-pay

## 2015-02-02 NOTE — Telephone Encounter (Signed)
Returned call to pt daughter re: biopsy on 2/3.  Let daughter know results " Lymph node, needle/core biopsy - BENIGN LYMPH NODE WITH SLIGHT PARACORTICAL HYPERPLASIA. - NO MALIGNANCY IDENTIFIED."  Daughter voiced understanding.  -

## 2015-02-18 ENCOUNTER — Other Ambulatory Visit: Payer: Self-pay | Admitting: *Deleted

## 2015-02-18 ENCOUNTER — Telehealth: Payer: Self-pay | Admitting: *Deleted

## 2015-02-18 ENCOUNTER — Telehealth: Payer: Self-pay | Admitting: Hematology and Oncology

## 2015-02-18 NOTE — Telephone Encounter (Signed)
lvm for pt regarding to 2.26 appt.Marland KitchenMarland Kitchen

## 2015-02-18 NOTE — Telephone Encounter (Signed)
Received call from patient's daughter Veronica Pierce wanting an appt, states that patient is c/o pain in the right lung. Returned call to [facility] and sent pof for patient to be seen tomorrow per Dr. Lindi Adie.

## 2015-02-19 ENCOUNTER — Ambulatory Visit (HOSPITAL_BASED_OUTPATIENT_CLINIC_OR_DEPARTMENT_OTHER): Payer: Medicare Other | Admitting: Hematology and Oncology

## 2015-02-19 ENCOUNTER — Telehealth: Payer: Self-pay | Admitting: Hematology and Oncology

## 2015-02-19 VITALS — BP 160/70 | HR 95 | Temp 97.7°F | Resp 18 | Ht 65.0 in | Wt 103.1 lb

## 2015-02-19 DIAGNOSIS — C50911 Malignant neoplasm of unspecified site of right female breast: Secondary | ICD-10-CM

## 2015-02-19 DIAGNOSIS — C50912 Malignant neoplasm of unspecified site of left female breast: Secondary | ICD-10-CM

## 2015-02-19 DIAGNOSIS — M810 Age-related osteoporosis without current pathological fracture: Secondary | ICD-10-CM

## 2015-02-19 DIAGNOSIS — Z170 Estrogen receptor positive status [ER+]: Secondary | ICD-10-CM

## 2015-02-19 DIAGNOSIS — R918 Other nonspecific abnormal finding of lung field: Secondary | ICD-10-CM

## 2015-02-19 MED ORDER — IPRATROPIUM-ALBUTEROL 20-100 MCG/ACT IN AERS: 1.0000 | INHALATION_SPRAY | Freq: Four times a day (QID) | RESPIRATORY_TRACT | Status: DC

## 2015-02-19 NOTE — Assessment & Plan Note (Signed)
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  Letrozole toxicities: Osteoporosis with osteoporotic compression fractures currently on bisphosphonates Recommendation: Complete 5 years of therapy.  Breast cancer surveillance: No indication for imaging studies, chest wall examination previously was normal

## 2015-02-19 NOTE — Telephone Encounter (Signed)
lvm for pt regarding to may appt....mailed pt appt sched/avs adn letter

## 2015-02-19 NOTE — Progress Notes (Signed)
Patient Care Team: Lucille Passy, MD as PCP - General (Family Medicine)  DIAGNOSIS: No matching staging information was found for the patient.  SUMMARY OF ONCOLOGIC HISTORY:   Breast cancer, left breast   08/26/2009 Surgery Left breast mastectomy: well-differentiated infiltrating ductal carcinoma 2 cm grade 3 with negative nodes ER positive PR positive HER-2/neu negative( stage II) Oncotype DX recurrence score 41 status post chemotherapy   10/29/2009 - 01/03/2010 Chemotherapy Taxotere and Cytoxan 4 cycles   02/08/2010 -  Anti-estrogen oral therapy Arimidex 1 mg daily later changed to Letrozole 2.5 mg daily   03/30/2011 PET scan Lung nodules treated with radiosurgery in [place] on 06/26/2011 repeat PET/CT showed resolution, biopsy showed undifferentiated carcinoma.    Breast cancer, right breast   04/11/2012 Surgery Right breast mastectomy: 1.2 cm infiltrating ductal carcinoma with desmoplastic response ER negative PR negative HER-2/neu negative March 2013 (stage I)   12/02/2012 Surgery L1 compression fracture underwent kyphoplasty    CHIEF COMPLIANT: Shortness of breath and chest discomfort  INTERVAL HISTORY: Veronica Pierce is a 81 year old lady with above-mentioned history of bilateral breast cancers who also has lung nodules which unfortunately were not evaluated because of very frail lungs and very high risk for pneumothorax. I have discussed with interventional radiology who would not do the biopsy because she already has spontaneous pneumothorax. Patient had prior chest radiation which had led to pneumonitis and scar tissue formation. She reports shortness of breath to minimal exertion as well as intermittent right chest wall pain, which is why they requested an appointment to see me today.  REVIEW OF SYSTEMS:   Constitutional: Denies fevers, chills or abnormal weight loss Eyes: Denies blurriness of vision Ears, nose, mouth, throat, and face: Denies mucositis or sore throat Respiratory: Chest  wall pain or shortness of breath or exertion Cardiovascular: Denies palpitation, chest discomfort or lower extremity swelling Gastrointestinal:  Denies nausea, heartburn or change in bowel habits Skin: Denies abnormal skin rashes Lymphatics: Denies new lymphadenopathy or easy bruising Neurological:Denies numbness, tingling or new weaknesses Behavioral/Psych: Mood is stable, no new changes  All other systems were reviewed with the patient and are negative.  I have reviewed the past medical history, past surgical history, social history and family history with the patient and they are unchanged from previous note.  ALLERGIES:  has No Known Allergies.  MEDICATIONS:  Current Outpatient Prescriptions  Medication Sig Dispense Refill  . alendronate (FOSAMAX) 35 MG tablet TAKE 1 TABLET BY MOUTH EVERY 7 DAYS. TAKE WITH A FULL GLASS OF WATER ON AN EMPTY STOMACH 12 tablet 0  . calcium gluconate 500 MG tablet Take 1,000 mg by mouth daily.    . Cyanocobalamin (VITAMIN B 12 PO) Take 1 tablet by mouth daily.    Marland Kitchen guaifenesin (HUMIBID E) 400 MG TABS tablet Take 400 mg by mouth daily as needed (congestion.).    Marland Kitchen HYDROcodone-acetaminophen (NORCO/VICODIN) 5-325 MG per tablet Take 1-2 tablets by mouth every 4 (four) hours as needed for moderate pain. 60 tablet 0  . letrozole (FEMARA) 2.5 MG tablet TAKE 1 TABLET (2.5 MG TOTAL) BY MOUTH DAILY. 90 tablet 3  . levothyroxine (SYNTHROID, LEVOTHROID) 100 MCG tablet Take 1 tablet (100 mcg total) by mouth daily before breakfast. 30 tablet 2  . pilocarpine (SALAGEN) 5 MG tablet Take 5 mg by mouth every 12 (twelve) hours.    . Ipratropium-Albuterol (COMBIVENT) 20-100 MCG/ACT AERS respimat Inhale 1 puff into the lungs every 6 (six) hours. 3 Inhaler 6   No current facility-administered medications  for this visit.    PHYSICAL EXAMINATION: ECOG PERFORMANCE STATUS: 1 - Symptomatic but completely ambulatory  Filed Vitals:   02/19/15 0917  BP: 160/70  Pulse: 95   Temp: 97.7 F (36.5 C)  Resp: 18   Filed Weights   02/19/15 0917  Weight: 103 lb 1.6 oz (46.766 kg)    GENERAL:alert, no distress and comfortable SKIN: skin color, texture, turgor are normal, no rashes or significant lesions EYES: normal, Conjunctiva are pink and non-injected, sclera clear OROPHARYNX:no exudate, no erythema and lips, buccal mucosa, and tongue normal  NECK: supple, thyroid normal size, non-tender, without nodularity LYMPH:  no palpable lymphadenopathy in the cervical, axillary or inguinal LUNGS: clear to auscultation and percussion with normal breathing effort HEART: regular rate & rhythm and no murmurs and no lower extremity edema ABDOMEN:abdomen soft, non-tender and normal bowel sounds Musculoskeletal:no cyanosis of digits and no clubbing  NEURO: alert & oriented x 3 with fluent speech, no focal motor/sensory deficits  LABORATORY DATA:  I have reviewed the data as listed   Chemistry      Component Value Date/Time   NA 139 01/19/2015 1032   NA 143 01/30/2014 0724   K 4.3 01/19/2015 1032   K 3.8 01/30/2014 0724   CL 108 01/30/2014 0724   CO2 28 01/19/2015 1032   CO2 24 01/30/2014 0724   BUN 16.6 01/19/2015 1032   BUN 14 01/30/2014 0724   CREATININE 0.8 01/19/2015 1032   CREATININE 0.54 01/30/2014 0724      Component Value Date/Time   CALCIUM 9.3 01/19/2015 1032   CALCIUM 8.2* 01/30/2014 0724   ALKPHOS 74 01/19/2015 1032   ALKPHOS 74 01/30/2014 0724   AST 27 01/19/2015 1032   AST 34 01/30/2014 0724   ALT 19 01/19/2015 1032   ALT 51* 01/30/2014 0724   BILITOT 0.58 01/19/2015 1032   BILITOT 0.5 01/30/2014 0724       Lab Results  Component Value Date   WBC 6.8 01/27/2015   HGB 12.5 01/27/2015   HCT 39.5 01/27/2015   MCV 95.6 01/27/2015   PLT 185 01/27/2015   NEUTROABS 6.7* 01/19/2015    ASSESSMENT & PLAN:  Breast cancer, left breast Left breast invasive ductal carcinoma: ER/PR positive HER-2 negative status post mastectomy followed by  adjuvant chemotherapy and is currently on oral antiestrogen therapy with letrozole 2.5 mg daily since 2011.  Letrozole toxicities: Osteoporosis with osteoporotic compression fractures currently on bisphosphonates Recommendation: Complete 5 years of therapy.  Breast cancer surveillance: No indication for imaging studies, chest wall examination previously was normal  Breast cancer, right breast Right breast cancer status post mastectomy ER/PR HER-2 negative followed by observation T1 cN0 M0 stage IA Breast cancer surveillance: Physical examinations and annual follow-ups  L1 compression fracture: Most likely secondary to osteoporosis. Currently on Fosamax once a week with calcium and vitamin D.  Multiple lung nodules Bilateral lung nodules which are hypermetabolic on PET/CT scan dated 01/11/2015, they could not be biopsied because of her very frail lungs. Hence we decided that we should biopsy the left axillary lymph node. Patient continues to have symptoms related to these lung nodules.  Goals of care: I discussed the family that the goals of care should be to keep her comfortable. And hence I did not recommend pursuing these lung nodules with either biopsies or routine CT scans.  Shortness of breath: I prescribed albuterol Atrovent inhaler to be used as needed. If she requires oxygen we can set her up with oxygen as well.  If she becomes more frail and requires assistance with ADLs, we can then recommend hospice care. Patient and family agree with this plan and they would like to keep her active as long as she wants to.  Return to clinic in 3 months for follow-up.  No orders of the defined types were placed in this encounter.   The patient has a good understanding of the overall plan. she agrees with it. She will call with any problems that may develop before her next visit here.   Rulon Eisenmenger, MD

## 2015-02-19 NOTE — Assessment & Plan Note (Addendum)
Bilateral lung nodules which are hypermetabolic on PET/CT scan dated 01/11/2015, they could not be biopsied because of her very frail lungs. Hence we decided that we should biopsy the left axillary lymph node. Patient continues to have symptoms related to these lung nodules.  I discussed the family that the goals of care should be to keep her comfortable. And hence I did not recommend pursuing these lung nodules with either biopsies are routine CT scans.  Shortness of breath: I prescribed albuterol Atrovent inhaler to be used as needed. If she requires oxygen we can set her up with oxygen as well. If she becomes more frail and requires assistance with ADLs, we can then recommend hospice care. Patient and family agree with this plan and they would like to keep her active as long as she wants to.  Return to clinic in 3 months for follow-up.

## 2015-02-19 NOTE — Assessment & Plan Note (Signed)
Right breast cancer status post mastectomy ER/PR HER-2 negative followed by observation T1 cN0 M0 stage IA Breast cancer surveillance: Physical examinations and annual follow-ups  L1 compression fracture: Most likely secondary to osteoporosis. Currently on Fosamax once a week with calcium and vitamin D.

## 2015-03-01 ENCOUNTER — Other Ambulatory Visit: Payer: Self-pay

## 2015-03-01 MED ORDER — HYDROCODONE-ACETAMINOPHEN 5-325 MG PO TABS: 1.0000 | ORAL_TABLET | ORAL | Status: DC | PRN

## 2015-03-01 NOTE — Telephone Encounter (Signed)
RX printed and signed, put in MYD box

## 2015-03-01 NOTE — Telephone Encounter (Signed)
Spoke to pt and informed her Rx is available for pickup from the front desk

## 2015-03-01 NOTE — Telephone Encounter (Signed)
Pt daugher left v/m requesting cb to pt when hydrocodone apap rx ready for pick up; wants to pick up on 03/02/15.Please advise.. Pt last sick visit was 12/16/14 and last f/u apt on 04/28/14.

## 2015-03-07 ENCOUNTER — Other Ambulatory Visit: Payer: Self-pay | Admitting: Family Medicine

## 2015-03-08 ENCOUNTER — Telehealth: Payer: Self-pay | Admitting: Family Medicine

## 2015-03-08 NOTE — Telephone Encounter (Signed)
Pt has appt with Dr Deborra Medina on 03/09/15 at 9 AM.

## 2015-03-08 NOTE — Telephone Encounter (Signed)
Patient Name: Veronica Pierce  DOB: 01-Oct-1931    Initial Comment Caller states her mother has tingling in her lips and has weakness. Caller is not currently with patient   Nurse Assessment  Nurse: Verlin Fester, RN, Stanton Kidney Date/Time Eilene Ghazi Time): 03/08/2015 9:28:14 AM  Confirm and document reason for call. If symptomatic, describe symptoms. ---Daughter states her mother has tingling in her lips and has weakness. Caller is not currently with patient. States she has had this for a while. She was given B 12 for this in the past.  Has the patient traveled out of the country within the last 30 days? ---No  Does the patient require triage? ---Yes  Related visit to physician within the last 2 weeks? ---No  Does the PT have any chronic conditions? (i.e. diabetes, asthma, etc.) ---Yes  List chronic conditions. ---"throat, breast and lung cancer     Guidelines    Guideline Title Affirmed Question Affirmed Notes  Weakness (Generalized) and Fatigue [1] MODERATE weakness (i.e., interferes with work, school, normal activities) AND [2] persists > 3 days    Final Disposition User   See Physician within 24 Hours Noe, RN, Stanton Kidney    Comments  Daughter thinks she needs B-12 shots and asking if she needs lab work done today to be back for visit tomorrow, please call her back

## 2015-03-09 ENCOUNTER — Encounter: Payer: Self-pay | Admitting: Family Medicine

## 2015-03-09 ENCOUNTER — Ambulatory Visit (INDEPENDENT_AMBULATORY_CARE_PROVIDER_SITE_OTHER): Payer: Medicare Other | Admitting: Family Medicine

## 2015-03-09 VITALS — BP 164/72 | HR 92 | Temp 98.3°F | Wt 102.5 lb

## 2015-03-09 DIAGNOSIS — R05 Cough: Secondary | ICD-10-CM

## 2015-03-09 DIAGNOSIS — R202 Paresthesia of skin: Secondary | ICD-10-CM

## 2015-03-09 DIAGNOSIS — E538 Deficiency of other specified B group vitamins: Secondary | ICD-10-CM | POA: Diagnosis not present

## 2015-03-09 DIAGNOSIS — R5383 Other fatigue: Secondary | ICD-10-CM

## 2015-03-09 DIAGNOSIS — R059 Cough, unspecified: Secondary | ICD-10-CM

## 2015-03-09 LAB — CBC WITH DIFFERENTIAL/PLATELET
Basophils Absolute: 0 10*3/uL (ref 0.0–0.1)
Basophils Relative: 0.4 % (ref 0.0–3.0)
EOS ABS: 0.1 10*3/uL (ref 0.0–0.7)
Eosinophils Relative: 1.2 % (ref 0.0–5.0)
HCT: 38.7 % (ref 36.0–46.0)
Hemoglobin: 13 g/dL (ref 12.0–15.0)
Lymphocytes Relative: 7.6 % — ABNORMAL LOW (ref 12.0–46.0)
Lymphs Abs: 0.6 10*3/uL — ABNORMAL LOW (ref 0.7–4.0)
MCHC: 33.7 g/dL (ref 30.0–36.0)
MCV: 92.1 fl (ref 78.0–100.0)
Monocytes Absolute: 0.9 10*3/uL (ref 0.1–1.0)
Monocytes Relative: 11.5 % (ref 3.0–12.0)
NEUTROS PCT: 79.3 % — AB (ref 43.0–77.0)
Neutro Abs: 5.9 10*3/uL (ref 1.4–7.7)
PLATELETS: 186 10*3/uL (ref 150.0–400.0)
RBC: 4.2 Mil/uL (ref 3.87–5.11)
RDW: 14.1 % (ref 11.5–15.5)
WBC: 7.4 10*3/uL (ref 4.0–10.5)

## 2015-03-09 LAB — TSH: TSH: 2.89 u[IU]/mL (ref 0.35–4.50)

## 2015-03-09 LAB — VITAMIN B12

## 2015-03-09 MED ORDER — CYANOCOBALAMIN 1000 MCG/ML IJ SOLN
1000.0000 ug | Freq: Once | INTRAMUSCULAR | Status: AC
  Administered 2015-03-09: 1000 ug via INTRAMUSCULAR

## 2015-03-09 MED ORDER — CETIRIZINE HCL 5 MG PO TABS: 5.0000 mg | ORAL_TABLET | Freq: Every day | ORAL | Status: AC

## 2015-03-09 NOTE — Patient Instructions (Signed)
Good to see you. We will call you with your lab results. Start Zyrtec 5 mg daily.  Keep me updated.

## 2015-03-09 NOTE — Assessment & Plan Note (Signed)
Likely due to worsening B12 deficiency. If it does not resolved after a few weeks, she will let me know.  She will call sooner if it progresses. The patient indicates understanding of these issues and agrees with the plan.

## 2015-03-09 NOTE — Progress Notes (Signed)
Pre visit review using our clinic review tool, if applicable. No additional management support is needed unless otherwise documented below in the visit note. 

## 2015-03-09 NOTE — Assessment & Plan Note (Signed)
Persistent- likely multifactorial- malignancy, reactive airway, ? Component of allergic rhinitis. Advised adding an antihistamine- zyrtec 5 mg daily. The patient indicates understanding of these issues and agrees with the plan.

## 2015-03-09 NOTE — Addendum Note (Signed)
Addended by: Modena Nunnery on: 03/09/2015 09:43 AM   Modules accepted: Orders

## 2015-03-09 NOTE — Progress Notes (Signed)
Subjective:   Patient ID: Veronica Pierce, female    DOB: 1931/02/06, 81 y.o.   MRN: [id number]  Laela Deviney is a pleasant 80 y.o. year old female who presents to clinic today with her husband and daughter for Fatigue and Tingling  on 03/09/2015  HPI:  Fatigue- worsening for past few weeks.  Has also noticed tingling around her mouth and fingers again, like she had previously with B12 deficiency.  Taking OTC Vit B12 1000 mcg daily.  Cough- persistent despite mucinex.  Oncologist put her on combivent recently because he heard wheezes/crackles in her lungs.  She is not sure if it has helped yet.  Nose is "constantly running," drainage is clear.   Cough is dry.  No CP.  No SOB except when coughing. No nausea or vomiting.  Lab Results  Component Value Date   [id number] 703 10/28/2014    Lab Results  Component Value Date   TSH 0.632 01/27/2014     Current Outpatient Prescriptions on File Prior to Visit  Medication Sig Dispense Refill  . alendronate (FOSAMAX) 35 MG tablet TAKE 1 TABLET BY MOUTH EVERY 7 DAYS. TAKE WITH A FULL GLASS OF WATER ON AN EMPTY STOMACH 12 tablet 0  . calcium gluconate 500 MG tablet Take 1,000 mg by mouth daily.    . Cyanocobalamin (VITAMIN B 12 PO) Take 1 tablet by mouth daily.    Marland Kitchen guaifenesin (HUMIBID E) 400 MG TABS tablet Take 400 mg by mouth daily as needed (congestion.).    Marland Kitchen HYDROcodone-acetaminophen (NORCO/VICODIN) 5-325 MG per tablet Take 1-2 tablets by mouth every 4 (four) hours as needed for moderate pain. 60 tablet 0  . Ipratropium-Albuterol (COMBIVENT) 20-100 MCG/ACT AERS respimat Inhale 1 puff into the lungs every 6 (six) hours. 3 Inhaler 6  . letrozole (FEMARA) 2.5 MG tablet TAKE 1 TABLET (2.5 MG TOTAL) BY MOUTH DAILY. 90 tablet 3  . levothyroxine (SYNTHROID, LEVOTHROID) 100 MCG tablet TAKE 1 TABLET (100 MCG TOTAL) BY MOUTH DAILY BEFORE BREAKFAST. 30 tablet 0  . pilocarpine (SALAGEN) 5 MG tablet Take 5 mg by mouth every 12 (twelve) hours.     No  current facility-administered medications on file prior to visit.    No Known Allergies  Past Medical History  Diagnosis Date  . Throat cancer 2010  . Breast cancer 2010    left   . Lung cancer 2012  . Breast cancer 2013    right  . Hypothyroidism   . HTN (hypertension)     Past Surgical History  Procedure Laterality Date  . Mastectomy Bilateral     2010, 2013  . Mastectomy  bilateral  . Back surgery    . Portacath placement    . Port-a-cath removal    . Cholecystectomy N/A 01/28/2014    Procedure: LAPAROSCOPIC CHOLECYSTECTOMY;  Surgeon: Rolm Bookbinder, MD;  Location: [facility] OR;  Service: General;  Laterality: N/A;    Family History  Problem Relation Age of Onset  . Diabetes Mother   . Arthritis Father     History   Social History  . Marital Status: Married    Spouse Name: N/A  . Number of Children: N/A  . Years of Education: N/A   Occupational History  . Not on file.   Social History Main Topics  . Smoking status: Never Smoker   . Smokeless tobacco: Never Used  . Alcohol Use: No  . Drug Use: No  . Sexual Activity: Not Currently   Other Topics Concern  .  Not on file   Social History Narrative   Recently moved with here with husband to [facility].   Daughter lives here and is my patient.   The PMH, PSH, Social History, Family History, Medications, and allergies have been reviewed in [facility], and have been updated if relevant.   Review of Systems  Constitutional: Positive for fatigue. Negative for fever.  HENT: Positive for congestion, postnasal drip, rhinorrhea and sore throat. Negative for ear discharge, ear pain, facial swelling, hearing loss, sinus pressure, sneezing, trouble swallowing and voice change.   Respiratory: Positive for cough. Negative for choking, chest tightness and wheezing.   Cardiovascular: Negative.   Gastrointestinal: Negative.   Genitourinary: Negative.   Musculoskeletal: Negative.   Skin: Negative.   Neurological: Negative.     Psychiatric/Behavioral: Negative.   All other systems reviewed and are negative.      Objective:    BP 164/72 mmHg  Pulse 92  Temp(Src) 98.3 F (36.8 C) (Oral)  Wt 102 lb 8 oz (46.494 kg)  SpO2 97% Wt Readings from Last 3 Encounters:  03/09/15 102 lb 8 oz (46.494 kg)  02/19/15 103 lb 1.6 oz (46.766 kg)  01/19/15 104 lb 9.6 oz (47.446 kg)     Physical Exam  Constitutional: She is oriented to person, place, and time. She appears well-developed and well-nourished. No distress.  HENT:  Head: Normocephalic and atraumatic.  Right Ear: Hearing and tympanic membrane normal.  Left Ear: Hearing and tympanic membrane normal.  Nose: Rhinorrhea present. No sinus tenderness. Right sinus exhibits no maxillary sinus tenderness and no frontal sinus tenderness. Left sinus exhibits no maxillary sinus tenderness and no frontal sinus tenderness.  Mouth/Throat: Uvula is midline and mucous membranes are normal. Posterior oropharyngeal erythema present. No oropharyngeal exudate.  Cardiovascular: Tachycardia present.   Pulmonary/Chest: Effort normal. No accessory muscle usage. Apnea noted. No tachypnea. No respiratory distress. She has decreased breath sounds in the right lower field. She has wheezes in the left upper field and the left middle field. She has no rhonchi. She has no rales.  Musculoskeletal: Normal range of motion.  Neurological: She is alert and oriented to person, place, and time. No cranial nerve deficit.  Skin: Skin is warm and dry. No rash noted.  Psychiatric: She has a normal mood and affect. Her behavior is normal. Judgment and thought content normal.          Assessment & Plan:   B12 deficiency  Tingling No Follow-up on file.

## 2015-03-09 NOTE — Assessment & Plan Note (Signed)
?   Not absorbing oral B12 as well. IM B12 given to her today. Recheck B12 today. The patient indicates understanding of these issues and agrees with the plan.

## 2015-03-16 ENCOUNTER — Telehealth: Payer: Self-pay | Admitting: *Deleted

## 2015-03-16 NOTE — Telephone Encounter (Signed)
Patient's daughter, Jackelyn Poling called and asked if pt still needed to come in for next B12 injection scheduled in April or if she should have repeat labs or a follow up instead. She is still fatigued, but isn't sure if it's due to all of her other medical problems. Please call Debbie at [phone] and advise. Thanks!

## 2015-03-17 NOTE — Telephone Encounter (Signed)
We just did her labs so we do not need to repeat them.  I would still go ahead with B12 next month but I agree- she probably needs a follow up appointment.

## 2015-03-17 NOTE — Telephone Encounter (Signed)
Called and spoken to patient's daughter Jackelyn Poling). Notified her of Dr Hulen Shouts comments. Patient's daughter verbalized understanding.

## 2015-03-26 ENCOUNTER — Other Ambulatory Visit: Payer: Self-pay | Admitting: *Deleted

## 2015-03-26 NOTE — Telephone Encounter (Signed)
Pt's daughter calls requesting pt's hydrocodone rx. Pt will be out of med on tues. Her daughter has an appt on tues for labs and can pick up rx then.

## 2015-03-27 MED ORDER — HYDROCODONE-ACETAMINOPHEN 5-325 MG PO TABS: 1.0000 | ORAL_TABLET | ORAL | Status: DC | PRN

## 2015-03-30 MED ORDER — HYDROCODONE-ACETAMINOPHEN 5-325 MG PO TABS: 1.0000 | ORAL_TABLET | ORAL | Status: DC | PRN

## 2015-03-30 NOTE — Telephone Encounter (Signed)
Husband here in office to pick up Rx.  Looks like Rx got approved on 03/27/2015 (Saturday) so if never printed.  Prescription reprinted and signed by Dr. Deborra Medina.

## 2015-03-30 NOTE — Addendum Note (Signed)
Addended by: Carter Kitten on: 03/30/2015 11:57 AM   Modules accepted: Orders

## 2015-03-31 ENCOUNTER — Other Ambulatory Visit: Payer: Self-pay | Admitting: Hematology and Oncology

## 2015-03-31 NOTE — Telephone Encounter (Signed)
Last OV 02/19/15.  Next OV 05/14/15.  Chart reviewed.

## 2015-04-02 ENCOUNTER — Other Ambulatory Visit: Payer: Self-pay | Admitting: Family Medicine

## 2015-04-09 ENCOUNTER — Ambulatory Visit (INDEPENDENT_AMBULATORY_CARE_PROVIDER_SITE_OTHER): Payer: Medicare Other

## 2015-04-09 DIAGNOSIS — E538 Deficiency of other specified B group vitamins: Secondary | ICD-10-CM | POA: Diagnosis not present

## 2015-04-09 MED ORDER — CYANOCOBALAMIN 1000 MCG/ML IJ SOLN
1000.0000 ug | Freq: Once | INTRAMUSCULAR | Status: AC
  Administered 2015-04-09: 1000 ug via INTRAMUSCULAR

## 2015-04-20 ENCOUNTER — Encounter: Payer: Self-pay | Admitting: Family Medicine

## 2015-04-20 ENCOUNTER — Ambulatory Visit (INDEPENDENT_AMBULATORY_CARE_PROVIDER_SITE_OTHER): Payer: Medicare Other | Admitting: Family Medicine

## 2015-04-20 VITALS — BP 112/62 | HR 100 | Temp 98.2°F | Wt 100.5 lb

## 2015-04-20 DIAGNOSIS — C140 Malignant neoplasm of pharynx, unspecified: Secondary | ICD-10-CM

## 2015-04-20 DIAGNOSIS — C50912 Malignant neoplasm of unspecified site of left female breast: Secondary | ICD-10-CM

## 2015-04-20 DIAGNOSIS — G894 Chronic pain syndrome: Secondary | ICD-10-CM

## 2015-04-20 DIAGNOSIS — E538 Deficiency of other specified B group vitamins: Secondary | ICD-10-CM

## 2015-04-20 MED ORDER — OXYCODONE HCL 5 MG PO TABS: 5.0000 mg | ORAL_TABLET | Freq: Two times a day (BID) | ORAL | Status: DC | PRN

## 2015-04-20 NOTE — Assessment & Plan Note (Signed)
Deteriorated. >25 minutes spent in face to face time with patient, >50% spent in counselling or coordination of care Due to metastatic CA and ?spinal stenosis/DDD. Discussed tx options and agreed to the following pain management plan:  1.  D/c vicodin. 2.  Oxycodone 5 mg twice daily 2.  Tylenol ES- 1 tablet twice daily 4.  Ibuprofen 400-800 mg with food mid day as needed.  The patient indicates understanding of these issues and agrees with the plan.

## 2015-04-20 NOTE — Progress Notes (Signed)
Subjective:   Patient ID: Veronica Pierce, female    DOB: 01/10/1931, 81 y.o.   MRN: [id number]  Veronica Pierce is a pleasant 81 y.o. year old female who presents to clinic today with her  daughter for Follow-up  on 04/20/2015  HPI:  Chronic pain- metastatic CA. Pain has worsened recently.  Mainly in her low back and in the morning. Was previously taking Vicodin at bedtime, now taking twice daily. Appetite remains fair, weight relatively stable (was 102 lb in 04/2014).  Took oxycodone in past but denied by [facility]. Not taking any other pain rx.  B12 deficiency- fatigue and paresthesias improved with IM B12- has already received her IM B12 this month (04/09/15).     Lab Results  Component Value Date   VITAMINB12 >1500* 03/09/2015    Lab Results  Component Value Date   TSH 2.89 03/09/2015     Current Outpatient Prescriptions on File Prior to Visit  Medication Sig Dispense Refill  . alendronate (FOSAMAX) 35 MG tablet TAKE 1 TABLET BY MOUTH EVERY 7 DAYS. TAKE WITH A FULL GLASS OF WATER ON AN EMPTY STOMACH 48 tablet 0  . calcium gluconate 500 MG tablet Take 1,000 mg by mouth daily.    . cetirizine (ZYRTEC) 5 MG tablet Take 1 tablet (5 mg total) by mouth daily. 30 tablet   . Cyanocobalamin (VITAMIN B 12 PO) Take 1 tablet by mouth daily.    Marland Kitchen guaifenesin (HUMIBID E) 400 MG TABS tablet Take 400 mg by mouth daily as needed (congestion.).    Marland Kitchen HYDROcodone-acetaminophen (NORCO/VICODIN) 5-325 MG per tablet Take 1-2 tablets by mouth every 4 (four) hours as needed for moderate pain. 60 tablet 0  . Ipratropium-Albuterol (COMBIVENT) 20-100 MCG/ACT AERS respimat Inhale 1 puff into the lungs every 6 (six) hours. 3 Inhaler 6  . letrozole (FEMARA) 2.5 MG tablet TAKE 1 TABLET (2.5 MG TOTAL) BY MOUTH DAILY. 90 tablet 3  . levothyroxine (SYNTHROID, LEVOTHROID) 100 MCG tablet TAKE 1 TABLET (100 MCG TOTAL) BY MOUTH DAILY BEFORE BREAKFAST. 30 tablet 11  . pilocarpine (SALAGEN) 5 MG tablet Take 5  mg by mouth every 12 (twelve) hours.     No current facility-administered medications on file prior to visit.    No Known Allergies  Past Medical History  Diagnosis Date  . Throat cancer 2010  . Breast cancer 2010    left   . Lung cancer 2012  . Breast cancer 2013    right  . Hypothyroidism   . HTN (hypertension)     Past Surgical History  Procedure Laterality Date  . Mastectomy Bilateral     2010, 2013  . Mastectomy  bilateral  . Back surgery    . Portacath placement    . Port-a-cath removal    . Cholecystectomy N/A 01/28/2014    Procedure: LAPAROSCOPIC CHOLECYSTECTOMY;  Surgeon: Rolm Bookbinder, MD;  Location: [facility] OR;  Service: General;  Laterality: N/A;    Family History  Problem Relation Age of Onset  . Diabetes Mother   . Arthritis Father     History   Social History  . Marital Status: Married    Spouse Name: N/A  . Number of Children: N/A  . Years of Education: N/A   Occupational History  . Not on file.   Social History Main Topics  . Smoking status: Never Smoker   . Smokeless tobacco: Never Used  . Alcohol Use: No  . Drug Use: No  . Sexual Activity: Not Currently  Other Topics Concern  . Not on file   Social History Narrative   Recently moved with here with husband to [facility].   Daughter lives here and is my patient.   The PMH, PSH, Social History, Family History, Medications, and allergies have been reviewed in [facility], and have been updated if relevant.   Review of Systems  Constitutional: Positive for fatigue. Negative for fever.  HENT: Negative for ear discharge, ear pain, facial swelling, hearing loss, sinus pressure, sneezing, trouble swallowing and voice change.   Respiratory: Negative for cough, choking, chest tightness and wheezing.   Cardiovascular: Negative.   Gastrointestinal: Negative.   Genitourinary: Negative.   Musculoskeletal: Positive for back pain.  Skin: Negative.   Neurological: Negative.   Psychiatric/Behavioral:  Negative.   All other systems reviewed and are negative.      Objective:    BP 112/62 mmHg  Pulse 100  Temp(Src) 98.2 F (36.8 C) (Oral)  Wt 100 lb 8 oz (45.587 kg)  SpO2 96% Wt Readings from Last 3 Encounters:  04/20/15 100 lb 8 oz (45.587 kg)  03/09/15 102 lb 8 oz (46.494 kg)  02/19/15 103 lb 1.6 oz (46.766 kg)     Physical Exam  Constitutional: She is oriented to person, place, and time. She appears well-developed and well-nourished. No distress.  HENT:  Head: Normocephalic and atraumatic.  Right Ear: Hearing and tympanic membrane normal.  Left Ear: Hearing and tympanic membrane normal.  Nose: Rhinorrhea present. No sinus tenderness. Right sinus exhibits no maxillary sinus tenderness and no frontal sinus tenderness. Left sinus exhibits no maxillary sinus tenderness and no frontal sinus tenderness.  Mouth/Throat: Uvula is midline and mucous membranes are normal. Posterior oropharyngeal erythema present. No oropharyngeal exudate.  Cardiovascular: Tachycardia present.   Pulmonary/Chest: Effort normal. No accessory muscle usage. No apnea and no tachypnea. No respiratory distress. She has no decreased breath sounds. She has no wheezes. She has no rhonchi. She has no rales.  Musculoskeletal: Normal range of motion.  Neurological: She is alert and oriented to person, place, and time. No cranial nerve deficit.  Skin: Skin is warm and dry. No rash noted.  Psychiatric: She has a normal mood and affect. Her behavior is normal. Judgment and thought content normal.          Assessment & Plan:   No diagnosis found. No Follow-up on file.

## 2015-04-20 NOTE — Progress Notes (Signed)
Pre visit review using our clinic review tool, if applicable. No additional management support is needed unless otherwise documented below in the visit note. 

## 2015-04-20 NOTE — Patient Instructions (Addendum)
Good to see you.  Our new pain management plan:  1.  Oxycodone 5 mg twice daily 2.  Tylenol ES- 1 tablet twice daily 3.  Ibuprofen 400-800 mg with food mid day as needed.

## 2015-05-11 ENCOUNTER — Ambulatory Visit (INDEPENDENT_AMBULATORY_CARE_PROVIDER_SITE_OTHER): Payer: Medicare Other

## 2015-05-11 DIAGNOSIS — E538 Deficiency of other specified B group vitamins: Secondary | ICD-10-CM

## 2015-05-11 MED ORDER — CYANOCOBALAMIN 1000 MCG/ML IJ SOLN
1000.0000 ug | Freq: Once | INTRAMUSCULAR | Status: AC
  Administered 2015-05-11: 1000 ug via INTRAMUSCULAR

## 2015-05-14 ENCOUNTER — Telehealth: Payer: Self-pay | Admitting: Hematology and Oncology

## 2015-05-14 ENCOUNTER — Ambulatory Visit (HOSPITAL_BASED_OUTPATIENT_CLINIC_OR_DEPARTMENT_OTHER): Payer: Medicare Other | Admitting: Hematology and Oncology

## 2015-05-14 VITALS — BP 157/67 | HR 93 | Temp 97.8°F | Resp 17 | Ht 65.0 in | Wt 99.0 lb

## 2015-05-14 DIAGNOSIS — M810 Age-related osteoporosis without current pathological fracture: Secondary | ICD-10-CM

## 2015-05-14 DIAGNOSIS — C50911 Malignant neoplasm of unspecified site of right female breast: Secondary | ICD-10-CM

## 2015-05-14 DIAGNOSIS — Z853 Personal history of malignant neoplasm of breast: Secondary | ICD-10-CM | POA: Diagnosis not present

## 2015-05-14 DIAGNOSIS — R911 Solitary pulmonary nodule: Secondary | ICD-10-CM

## 2015-05-14 DIAGNOSIS — C50912 Malignant neoplasm of unspecified site of left female breast: Secondary | ICD-10-CM

## 2015-05-14 DIAGNOSIS — S32009A Unspecified fracture of unspecified lumbar vertebra, initial encounter for closed fracture: Secondary | ICD-10-CM | POA: Diagnosis not present

## 2015-05-14 DIAGNOSIS — R0602 Shortness of breath: Secondary | ICD-10-CM | POA: Diagnosis not present

## 2015-05-14 MED ORDER — IPRATROPIUM-ALBUTEROL 20-100 MCG/ACT IN AERS: 1.0000 | INHALATION_SPRAY | Freq: Four times a day (QID) | RESPIRATORY_TRACT | Status: DC

## 2015-05-14 NOTE — Assessment & Plan Note (Signed)
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  Letrozole toxicities: Osteoporosis with osteoporotic compression fractures currently on bisphosphonates Recommendation: Completed 5 years of therapy started February 2011 and completed February 2016.  Breast cancer surveillance: No indication for imaging studies, chest wall examination is normal

## 2015-05-14 NOTE — Progress Notes (Signed)
Patient Care Team: Lucille Passy, MD as PCP - General (Family Medicine)  DIAGNOSIS: No matching staging information was found for the patient.  SUMMARY OF ONCOLOGIC HISTORY:   Breast cancer, left breast   08/26/2009 Surgery Left breast mastectomy: well-differentiated infiltrating ductal carcinoma 2 cm grade 3 with negative nodes ER positive PR positive HER-2/neu negative( stage II) Oncotype DX recurrence score 41 status post chemotherapy   10/29/2009 - 01/03/2010 Chemotherapy Taxotere and Cytoxan 4 cycles   02/08/2010 -  Anti-estrogen oral therapy Arimidex 1 mg daily later changed to Letrozole 2.5 mg daily   03/30/2011 PET scan Lung nodules treated with radiosurgery in [place] on 06/26/2011 repeat PET/CT showed resolution, biopsy showed undifferentiated carcinoma.    Breast cancer, right breast   04/11/2012 Surgery Right breast mastectomy: 1.2 cm infiltrating ductal carcinoma with desmoplastic response ER negative PR negative HER-2/neu negative March 2013 (stage I)   12/02/2012 Surgery L1 compression fracture underwent kyphoplasty    CHIEF COMPLIANT: Chronic low back pain and fatigue  INTERVAL HISTORY: Veronica Pierce is a 81 year old with above-mentioned history of bilateral mastectomies and L1 compression fracture who comes in for routine follow-up she continues to have low back pain for which she was prescribed oxycodone which appears to be helping her symptoms remarkably well. The family would like to know what the results on the PET scan were with regards to her bones. The PET scan had shown no evidence of bone metastatic disease. Her breathing is much improved after using albuterol and Atrovent inhaler. To this it 3 times a day. Her lungs sound very clear. She continues to be fatigued which has been bothering her. This is clearly not shortness of breath. She has been on B-12 injections for anemia and it appears that her hemoglobin has got markedly better.   REVIEW OF SYSTEMS:   Constitutional:  Denies fevers, chills or abnormal weight loss Eyes: Denies blurriness of vision Ears, nose, mouth, throat, and face: Denies mucositis or sore throat Respiratory: Denies cough, dyspnea or wheezes Cardiovascular: Denies palpitation, chest discomfort or lower extremity swelling Gastrointestinal:  Denies nausea, heartburn or change in bowel habits Skin: Denies abnormal skin rashes Lymphatics: Denies new lymphadenopathy or easy bruising Neurological:Denies numbness, tingling or new weaknesses Behavioral/Psych: Mood is stable, no new changes   All other systems were reviewed with the patient and are negative.  I have reviewed the past medical history, past surgical history, social history and family history with the patient and they are unchanged from previous note.  ALLERGIES:  has No Known Allergies.  MEDICATIONS:  Current Outpatient Prescriptions  Medication Sig Dispense Refill  . alendronate (FOSAMAX) 35 MG tablet TAKE 1 TABLET BY MOUTH EVERY 7 DAYS. TAKE WITH A FULL GLASS OF WATER ON AN EMPTY STOMACH 48 tablet 0  . calcium gluconate 500 MG tablet Take 1,000 mg by mouth daily.    . cetirizine (ZYRTEC) 5 MG tablet Take 1 tablet (5 mg total) by mouth daily. 30 tablet   . Cyanocobalamin (VITAMIN B 12 PO) Take 1 tablet by mouth daily.    Marland Kitchen guaifenesin (HUMIBID E) 400 MG TABS tablet Take 400 mg by mouth daily as needed (congestion.).    Marland Kitchen HYDROcodone-acetaminophen (NORCO/VICODIN) 5-325 MG per tablet   0  . Ipratropium-Albuterol (COMBIVENT) 20-100 MCG/ACT AERS respimat Inhale 1 puff into the lungs every 6 (six) hours. 3 Inhaler 6  . letrozole (FEMARA) 2.5 MG tablet TAKE 1 TABLET (2.5 MG TOTAL) BY MOUTH DAILY. 90 tablet 3  . levothyroxine (SYNTHROID, LEVOTHROID)  100 MCG tablet TAKE 1 TABLET (100 MCG TOTAL) BY MOUTH DAILY BEFORE BREAKFAST. 30 tablet 11  . oxyCODONE (OXY IR/ROXICODONE) 5 MG immediate release tablet Take 1 tablet (5 mg total) by mouth 2 (two) times daily as needed for severe pain.  60 tablet 0  . pilocarpine (SALAGEN) 5 MG tablet Take 5 mg by mouth every 12 (twelve) hours.     No current facility-administered medications for this visit.    PHYSICAL EXAMINATION: ECOG PERFORMANCE STATUS: 2 - Symptomatic, <50% confined to bed  Filed Vitals:   05/14/15 0918  BP: 157/67  Pulse: 93  Temp: 97.8 F (36.6 C)  Resp: 17   Filed Weights   05/14/15 0918  Weight: 99 lb (44.906 kg)    GENERAL:alert, no distress and comfortable SKIN: skin color, texture, turgor are normal, no rashes or significant lesions EYES: normal, Conjunctiva are pink and non-injected, sclera clear OROPHARYNX:no exudate, no erythema and lips, buccal mucosa, and tongue normal  NECK: supple, thyroid normal size, non-tender, without nodularity LYMPH:  no palpable lymphadenopathy in the cervical, axillary or inguinal LUNGS: clear to auscultation and percussion with normal breathing effort HEART: regular rate & rhythm and no murmurs and no lower extremity edema ABDOMEN:abdomen soft, non-tender and normal bowel sounds Musculoskeletal:no cyanosis of digits and no clubbing  NEURO: alert & oriented x 3 with fluent speech, no focal motor/sensory deficits  LABORATORY DATA:  I have reviewed the data as listed   Chemistry      Component Value Date/Time   NA 139 01/19/2015 1032   NA 143 01/30/2014 0724   K 4.3 01/19/2015 1032   K 3.8 01/30/2014 0724   CL 108 01/30/2014 0724   CO2 28 01/19/2015 1032   CO2 24 01/30/2014 0724   BUN 16.6 01/19/2015 1032   BUN 14 01/30/2014 0724   CREATININE 0.8 01/19/2015 1032   CREATININE 0.54 01/30/2014 0724      Component Value Date/Time   CALCIUM 9.3 01/19/2015 1032   CALCIUM 8.2* 01/30/2014 0724   ALKPHOS 74 01/19/2015 1032   ALKPHOS 74 01/30/2014 0724   AST 27 01/19/2015 1032   AST 34 01/30/2014 0724   ALT 19 01/19/2015 1032   ALT 51* 01/30/2014 0724   BILITOT 0.58 01/19/2015 1032   BILITOT 0.5 01/30/2014 0724       Lab Results  Component Value Date    WBC 7.4 03/09/2015   HGB 13.0 03/09/2015   HCT 38.7 03/09/2015   MCV 92.1 03/09/2015   PLT 186.0 03/09/2015   NEUTROABS 5.9 03/09/2015    ASSESSMENT & PLAN:  Breast cancer, left breast Left breast invasive ductal carcinoma: ER/PR positive HER-2 negative status post mastectomy followed by adjuvant chemotherapy and is currently on oral antiestrogen therapy with letrozole 2.5 mg daily since 2011.  Letrozole toxicities: Osteoporosis with osteoporotic compression fractures currently on bisphosphonates Recommendation: Completed 5 years of therapy started February 2011 and completed February 2016.  Breast cancer surveillance: No indication for imaging studies, chest wall examination is normal   Breast cancer, right breast Right breast cancer status post mastectomy ER/PR HER-2 negative followed by observation T1 cN0 M0 stage IA Breast cancer surveillance: Physical examinations and annual follow-ups  L1 compression fracture: Most likely secondary to osteoporosis. Currently on Fosamax once a week with calcium and vitamin D.  Multiple lung nodules Bilateral lung nodules which are hypermetabolic on PET/CT scan dated 01/11/2015, they could not be biopsied because of her very frail lungs. Hence we decided that we should biopsy the  left axillary lymph node. Patient continues to have symptoms related to these lung nodules.  Goals of care: Comfort care.  Shortness of breath: On inhalers  Return to clinic in 6 months for follow-up    No orders of the defined types were placed in this encounter.   The patient has a good understanding of the overall plan. she agrees with it. she will call with any problems that may develop before the next visit here.   Rulon Eisenmenger, MD @DATE @

## 2015-05-14 NOTE — Telephone Encounter (Signed)
Gave avs & calendar for November.

## 2015-05-14 NOTE — Assessment & Plan Note (Signed)
Right breast cancer status post mastectomy ER/PR HER-2 negative followed by observation T1 cN0 M0 stage IA Breast cancer surveillance: Physical examinations and annual follow-ups  L1 compression fracture: Most likely secondary to osteoporosis. Currently on Fosamax once a week with calcium and vitamin D.  Multiple lung nodules Bilateral lung nodules which are hypermetabolic on PET/CT scan dated 01/11/2015, they could not be biopsied because of her very frail lungs. Hence we decided that we should biopsy the left axillary lymph node. Patient continues to have symptoms related to these lung nodules.  Goals of care: Comfort care.  Shortness of breath: On inhalers  Return to clinic in 6 months for follow-up

## 2015-05-18 ENCOUNTER — Other Ambulatory Visit: Payer: Self-pay | Admitting: Family Medicine

## 2015-05-18 ENCOUNTER — Other Ambulatory Visit: Payer: Self-pay

## 2015-05-18 MED ORDER — OXYCODONE HCL 5 MG PO TABS: 5.0000 mg | ORAL_TABLET | Freq: Two times a day (BID) | ORAL | Status: DC | PRN

## 2015-05-18 NOTE — Telephone Encounter (Signed)
Veronica Pierce pts daughter request rx oxycodone. Call when ready for pick up. Pt last seen and rx printed on 04/20/15. Pt has enough med thru 05/21/15 but pt daughter request to pick up by 05/21/15.

## 2015-05-18 NOTE — Telephone Encounter (Signed)
Spoke to pt and informed her Rx is available for pickup from the front desk 

## 2015-05-18 NOTE — Telephone Encounter (Signed)
Rx reprinted-unable to locate

## 2015-05-18 NOTE — Addendum Note (Signed)
Addended by: Modena Nunnery on: 05/18/2015 09:12 AM   Modules accepted: Orders

## 2015-05-18 NOTE — Addendum Note (Signed)
Addended by: Carter Kitten on: 05/18/2015 09:32 AM   Modules accepted: Orders

## 2015-06-10 ENCOUNTER — Other Ambulatory Visit: Payer: Self-pay | Admitting: Family Medicine

## 2015-06-10 DIAGNOSIS — E538 Deficiency of other specified B group vitamins: Secondary | ICD-10-CM

## 2015-06-14 ENCOUNTER — Other Ambulatory Visit: Payer: Self-pay

## 2015-06-14 NOTE — Telephone Encounter (Signed)
Forestine Na pts daughter left v/m requesting rx oxycodone. Call when ready for pick up. Pt has lab appt at [facility] on 06/18/15 and request to pick up rx at that time. Pt has enough med to last until 06/22/15.Please advise. rx last printed # 60 on 05/18/15 and last seen 04/20/15.

## 2015-06-15 ENCOUNTER — Other Ambulatory Visit: Payer: Medicare Other

## 2015-06-15 MED ORDER — OXYCODONE HCL 5 MG PO TABS: 5.0000 mg | ORAL_TABLET | Freq: Two times a day (BID) | ORAL | Status: DC | PRN

## 2015-06-15 NOTE — Telephone Encounter (Signed)
Veronica Pierce notified by telephone that script is up front ready for pickup.

## 2015-06-18 ENCOUNTER — Other Ambulatory Visit (INDEPENDENT_AMBULATORY_CARE_PROVIDER_SITE_OTHER): Payer: Medicare Other

## 2015-06-18 ENCOUNTER — Encounter: Payer: Self-pay | Admitting: Radiology

## 2015-06-18 DIAGNOSIS — E538 Deficiency of other specified B group vitamins: Secondary | ICD-10-CM

## 2015-06-18 LAB — VITAMIN B12: Vitamin B-12: 275 pg/mL (ref 211–911)

## 2015-07-16 ENCOUNTER — Other Ambulatory Visit: Payer: Self-pay | Admitting: *Deleted

## 2015-07-16 NOTE — Telephone Encounter (Signed)
Daughter left voicemail at triage requesting refill of med, daughter said pt will be out of med on Tuesday 07/20/15 morning, daughter is requesting refill to be picked up on Monday 07/19/15, daughter request call back when ready for pick-up

## 2015-07-16 NOTE — Telephone Encounter (Signed)
Ok to print rx and put on my desk for signature.

## 2015-07-19 MED ORDER — OXYCODONE HCL 5 MG PO TABS: 5.0000 mg | ORAL_TABLET | Freq: Two times a day (BID) | ORAL | Status: DC | PRN

## 2015-07-19 NOTE — Telephone Encounter (Signed)
Spoke to pt and informed her Rx is available for pickup from the front desk 

## 2015-07-20 ENCOUNTER — Encounter: Payer: Self-pay | Admitting: Family Medicine

## 2015-07-28 ENCOUNTER — Other Ambulatory Visit: Payer: Self-pay

## 2015-07-28 MED ORDER — PILOCARPINE HCL 5 MG PO TABS: 5.0000 mg | ORAL_TABLET | Freq: Two times a day (BID) | ORAL | Status: DC

## 2015-07-28 NOTE — Telephone Encounter (Signed)
Debbie pts daughter left v/m; [place] request refill pilocarpine to [facility] [address]. Pt last seen 04/20/15.

## 2015-08-09 ENCOUNTER — Encounter: Payer: Self-pay | Admitting: Family Medicine

## 2015-08-13 ENCOUNTER — Other Ambulatory Visit: Payer: Self-pay

## 2015-08-13 MED ORDER — OXYCODONE HCL 5 MG PO TABS: 5.0000 mg | ORAL_TABLET | Freq: Two times a day (BID) | ORAL | Status: DC | PRN

## 2015-08-13 NOTE — Telephone Encounter (Signed)
Ok to print out and put in my box for signature.

## 2015-08-13 NOTE — Telephone Encounter (Signed)
rx printed and placed in Dr Hulen Shouts inbox for signature 08/23

## 2015-08-13 NOTE — Telephone Encounter (Signed)
Forestine Na pt daughter (DPR signed) left v/m requesting rx oxycodone. Call when rx ready for pick up; Debbie request to pick rx up on 08/17/15. rx last printed # 60 on 07/19/15.pt seen 04/20/15. Dr Deborra Medina out of office but pt does not need until next week).

## 2015-08-16 ENCOUNTER — Telehealth: Payer: Self-pay | Admitting: Hematology and Oncology

## 2015-08-16 NOTE — Telephone Encounter (Signed)
RECEIVED IN BASKET MESSAGED AT 12:38PM. RETURN CALL AND LEFT MESSAGED AT 1:18PM. REQUESTED PT.'S DAUGHTER, [name], RETURN A CALL TO THE [facility] AND ASK FOR A TRIAGE NURSE.

## 2015-08-16 NOTE — Telephone Encounter (Signed)
Called patient;s daughter Forestine Na after hearing voicemail that "mom is having some difficulties and needs sooner appointment."  Reporst "she was given an inhaler by Dr. Lindi Adie and has a small hole in her lungs and he'd mentioned she may need to go to oxygen.  I think she needs oxygen and don't want to wait until November.  She's worse in the morning.  She's not getting around as well."  Will notify Dr. Lindi Adie.  Return number is H: [phone] or cell [phone].

## 2015-08-16 NOTE — Telephone Encounter (Signed)
Does not want to come in until next week.  Appt made for Fri 9/2 9 am.  POF sent.

## 2015-08-16 NOTE — Telephone Encounter (Signed)
Debbie called to ask for a sooner appointment due to issues that her mother is having

## 2015-08-16 NOTE — Telephone Encounter (Signed)
Lm on pts vm and informed her Rx is available for pickup from the front desk 

## 2015-08-17 ENCOUNTER — Telehealth: Payer: Self-pay | Admitting: Hematology and Oncology

## 2015-08-17 NOTE — Telephone Encounter (Signed)
Sooner appointment made and per pof patient/daughter is aware of time.

## 2015-08-27 ENCOUNTER — Encounter: Payer: Self-pay | Admitting: Hematology and Oncology

## 2015-08-27 ENCOUNTER — Ambulatory Visit (HOSPITAL_BASED_OUTPATIENT_CLINIC_OR_DEPARTMENT_OTHER): Payer: Medicare Other | Admitting: Hematology and Oncology

## 2015-08-27 ENCOUNTER — Telehealth: Payer: Self-pay | Admitting: Hematology and Oncology

## 2015-08-27 VITALS — BP 155/72 | HR 82 | Temp 97.8°F | Resp 18 | Ht 65.0 in | Wt 94.4 lb

## 2015-08-27 DIAGNOSIS — C50912 Malignant neoplasm of unspecified site of left female breast: Secondary | ICD-10-CM | POA: Diagnosis present

## 2015-08-27 DIAGNOSIS — C50911 Malignant neoplasm of unspecified site of right female breast: Secondary | ICD-10-CM

## 2015-08-27 NOTE — Assessment & Plan Note (Signed)
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  Osteoporosis with osteoporotic compression fractures currently on bisphosphonates Recommendation: Completed 5 years of therapy started February 2011 and completed February 2016.  Breast cancer surveillance: No indication for imaging studies, chest wall examination is normal

## 2015-08-27 NOTE — Telephone Encounter (Signed)
Gave avs & calendar for January 2017. Patient req January appointment instead for February

## 2015-08-27 NOTE — Assessment & Plan Note (Addendum)
Right breast cancer status post mastectomy ER/PR HER-2 negative followed by observation T1 cN0 M0 stage IA Breast cancer surveillance: Physical examinations and annual follow-ups  L1 compression fracture: Most likely secondary to osteoporosis. Currently on Fosamax once a week with calcium and vitamin D.  Multiple lung nodules Bilateral lung nodules which are hypermetabolic on PET/CT scan dated 01/11/2015, they could not be biopsied because of her very frail lungs. Hence we decided that we should biopsy the left axillary lymph node, which came back as benign.  If patient continues to deteriorate, she will be referred to hospice care.

## 2015-08-27 NOTE — Progress Notes (Signed)
Patient Care Team: Lucille Passy, MD as PCP - General (Family Medicine)  DIAGNOSIS: No matching staging information was found for the patient.  SUMMARY OF ONCOLOGIC HISTORY:   Breast cancer, left breast   08/26/2009 Surgery Left breast mastectomy: well-differentiated infiltrating ductal carcinoma 2 cm grade 3 with negative nodes ER positive PR positive HER-2/neu negative( stage II) Oncotype DX recurrence score 41 status post chemotherapy   10/29/2009 - 01/03/2010 Chemotherapy Taxotere and Cytoxan 4 cycles   02/08/2010 -  Anti-estrogen oral therapy Arimidex 1 mg daily later changed to Letrozole 2.5 mg daily   03/30/2011 PET scan Lung nodules treated with radiosurgery in [place] on 06/26/2011 repeat PET/CT showed resolution, biopsy showed undifferentiated carcinoma.   01/27/2015 Procedure Left axillary lymph node biopsy: Benign    Breast cancer, right breast   04/11/2012 Surgery Right breast mastectomy: 1.2 cm infiltrating ductal carcinoma with desmoplastic response ER negative PR negative HER-2/neu negative March 2013 (stage I)   12/02/2012 Surgery L1 compression fracture underwent kyphoplasty    CHIEF COMPLIANT: Follow-up of bilateral breast cancers and lung nodules  INTERVAL HISTORY: Veronica Pierce is a 81 year old lady with above-mentioned history of left breast cancer diagnosed in 2010. She presented with lung nodules in April 2012 biopsy showed undifferentiated carcinoma. She had persistent enlarged lung nodules as well as left axillary lymph nodes. Biopsy done on 01/27/2015 was negative. She is here for a six-month follow-up. Her family reports that she has persistent shortness of breath even to minimal exertion. She also complaining of low back pain. She is unable to do much walking because of back pain issues.  REVIEW OF SYSTEMS:   Constitutional: Denies fevers, chills or abnormal weight loss Eyes: Denies blurriness of vision Ears, nose, mouth, throat, and face: Denies mucositis or sore  throat Respiratory: Clear breath sounds bilaterally no wheezes or crackles. Cardiovascular: Denies palpitation, chest discomfort or lower extremity swelling Gastrointestinal:  Denies nausea, heartburn or change in bowel habits Skin: Denies abnormal skin rashes Lymphatics: Denies new lymphadenopathy or easy bruising Neurological:Denies numbness, tingling or new weaknesses Behavioral/Psych: Mood is stable, no new changes  All other systems were reviewed with the patient and are negative.  I have reviewed the past medical history, past surgical history, social history and family history with the patient and they are unchanged from previous note.  ALLERGIES:  has No Known Allergies.  MEDICATIONS:  Current Outpatient Prescriptions  Medication Sig Dispense Refill  . alendronate (FOSAMAX) 35 MG tablet TAKE 1 TABLET BY MOUTH EVERY 7 DAYS. TAKE WITH A FULL GLASS OF WATER ON AN EMPTY STOMACH 48 tablet 0  . calcium gluconate 500 MG tablet Take 1,000 mg by mouth daily.    . cetirizine (ZYRTEC) 5 MG tablet Take 1 tablet (5 mg total) by mouth daily. 30 tablet   . Cyanocobalamin (VITAMIN B 12 PO) Take 1 tablet by mouth daily.    Marland Kitchen guaifenesin (HUMIBID E) 400 MG TABS tablet Take 400 mg by mouth daily as needed (congestion.).    Marland Kitchen HYDROcodone-acetaminophen (NORCO/VICODIN) 5-325 MG per tablet   0  . Ipratropium-Albuterol (COMBIVENT) 20-100 MCG/ACT AERS respimat Inhale 1 puff into the lungs every 6 (six) hours. 3 Inhaler 6  . letrozole (FEMARA) 2.5 MG tablet TAKE 1 TABLET (2.5 MG TOTAL) BY MOUTH DAILY. 90 tablet 3  . levothyroxine (SYNTHROID, LEVOTHROID) 100 MCG tablet TAKE 1 TABLET (100 MCG TOTAL) BY MOUTH DAILY BEFORE BREAKFAST. 30 tablet 11  . oxyCODONE (OXY IR/ROXICODONE) 5 MG immediate release tablet Take 1 tablet (5  mg total) by mouth 2 (two) times daily as needed for severe pain. 60 tablet 0  . pilocarpine (SALAGEN) 5 MG tablet Take 1 tablet (5 mg total) by mouth every 12 (twelve) hours. 60 tablet 1    No current facility-administered medications for this visit.    PHYSICAL EXAMINATION: ECOG PERFORMANCE STATUS: 1 - Symptomatic but completely ambulatory  Filed Vitals:   08/27/15 0903  BP: 155/72  Pulse: 82  Temp: 97.8 F (36.6 C)  Resp: 18   Filed Weights   08/27/15 0903  Weight: 94 lb 6.4 oz (42.82 kg)    GENERAL:alert, no distress and comfortable SKIN: skin color, texture, turgor are normal, no rashes or significant lesions EYES: normal, Conjunctiva are pink and non-injected, sclera clear OROPHARYNX:no exudate, no erythema and lips, buccal mucosa, and tongue normal  NECK: supple, thyroid normal size, non-tender, without nodularity LYMPH:  no palpable lymphadenopathy in the cervical, axillary or inguinal LUNGS: clear to auscultation and percussion with normal breathing effort HEART: regular rate & rhythm and no murmurs and no lower extremity edema ABDOMEN:abdomen soft, non-tender and normal bowel sounds Musculoskeletal:no cyanosis of digits and no clubbing  NEURO: alert & oriented x 3 with fluent speech, no focal motor/sensory deficits LABORATORY DATA:  I have reviewed the data as listed   Chemistry      Component Value Date/Time   NA 139 01/19/2015 1032   NA 143 01/30/2014 0724   K 4.3 01/19/2015 1032   K 3.8 01/30/2014 0724   CL 108 01/30/2014 0724   CO2 28 01/19/2015 1032   CO2 24 01/30/2014 0724   BUN 16.6 01/19/2015 1032   BUN 14 01/30/2014 0724   CREATININE 0.8 01/19/2015 1032   CREATININE 0.54 01/30/2014 0724      Component Value Date/Time   CALCIUM 9.3 01/19/2015 1032   CALCIUM 8.2* 01/30/2014 0724   ALKPHOS 74 01/19/2015 1032   ALKPHOS 74 01/30/2014 0724   AST 27 01/19/2015 1032   AST 34 01/30/2014 0724   ALT 19 01/19/2015 1032   ALT 51* 01/30/2014 0724   BILITOT 0.58 01/19/2015 1032   BILITOT 0.5 01/30/2014 0724       Lab Results  Component Value Date   WBC 7.4 03/09/2015   HGB 13.0 03/09/2015   HCT 38.7 03/09/2015   MCV 92.1  03/09/2015   PLT 186.0 03/09/2015   NEUTROABS 5.9 03/09/2015   ASSESSMENT & PLAN:  Breast cancer, left breast Left breast invasive ductal carcinoma: ER/PR positive HER-2 negative status post mastectomy followed by adjuvant chemotherapy and is currently on oral antiestrogen therapy with letrozole 2.5 mg daily since 2011.  Osteoporosis with osteoporotic compression fractures currently on bisphosphonates Recommendation: Completed 5 years of therapy started February 2011 and completed February 2016.  Breast cancer surveillance: No indication for imaging studies, chest wall examination is normal  Breast cancer, right breast Right breast cancer status post mastectomy ER/PR HER-2 negative followed by observation T1 cN0 M0 stage IA Breast cancer surveillance: Physical examinations and annual follow-ups  L1 compression fracture: Most likely secondary to osteoporosis. Currently on Fosamax once a week with calcium and vitamin D.  Multiple lung nodules Bilateral lung nodules which are hypermetabolic on PET/CT scan dated 01/11/2015, they could not be biopsied because of her very frail lungs. Hence we decided that we should biopsy the left axillary lymph node, which came back as benign.  Shortness of breath: Patient is extremely short of breath even for short walks. We checked her oxygen saturation it does drop  when she walks around. One option would be to give her home oxygen if her symptoms continue to get worse. Because we are not planning to do any oncologic interventions, and not planning to obtain a CT scan. WE can   No orders of the defined types were placed in this encounter.   The patient has a good understanding of the overall plan. she agrees with it. she will call with any problems that may develop before the next visit here.   Rulon Eisenmenger, MD

## 2015-09-13 ENCOUNTER — Other Ambulatory Visit: Payer: Self-pay

## 2015-09-13 MED ORDER — OXYCODONE HCL 5 MG PO TABS: 5.0000 mg | ORAL_TABLET | Freq: Two times a day (BID) | ORAL | Status: DC | PRN

## 2015-09-13 NOTE — Telephone Encounter (Signed)
pts daughter left v/m requesting rx oxycodone. Pt has appt on 09/17/15 for flu shot and request to pick up at that time. rx last printed # 60 on 08/13/15. Last seen 04/20/15.Please advise.

## 2015-09-13 NOTE — Telephone Encounter (Signed)
Spoke to pt and informed her Rx is available for pickup from the front desk 

## 2015-09-17 ENCOUNTER — Ambulatory Visit (INDEPENDENT_AMBULATORY_CARE_PROVIDER_SITE_OTHER): Payer: Medicare Other

## 2015-09-17 DIAGNOSIS — Z23 Encounter for immunization: Secondary | ICD-10-CM

## 2015-09-21 ENCOUNTER — Other Ambulatory Visit: Payer: Self-pay | Admitting: Family Medicine

## 2015-10-07 ENCOUNTER — Other Ambulatory Visit: Payer: Self-pay | Admitting: Hematology and Oncology

## 2015-10-07 DIAGNOSIS — C50911 Malignant neoplasm of unspecified site of right female breast: Secondary | ICD-10-CM

## 2015-10-11 ENCOUNTER — Telehealth: Payer: Self-pay

## 2015-10-11 MED ORDER — OXYCODONE HCL 5 MG PO TABS: 5.0000 mg | ORAL_TABLET | Freq: Two times a day (BID) | ORAL | Status: DC | PRN

## 2015-10-11 NOTE — Telephone Encounter (Signed)
Ok to refill as requested and to schedule B12 lab.  She is up to date with her pneumonia vaccines.

## 2015-10-11 NOTE — Telephone Encounter (Signed)
Veronica Pierce pts daughter (DPR signed) left v/m requesting rx for oxycodone. Call when ready for pick up. rx last printed # 60 on 09/13/15. Wants to pick up on 10/15/15.Last F/U seen 04/20/15. Jackelyn Poling also wants to know when should get repeat B 12 lab testing ( per 06/22/15 lab result note B 12 should be repeated in 2 months (07/2015) and Veronica Pierce wants to know if pt needs pneumonia shot.  Veronica Pierce request answers to all questions upon cb.

## 2015-10-11 NOTE — Telephone Encounter (Signed)
Lm on pts vm and informed her Rx is available for pickup from the front desk. Pt advised to schedule b12 labs at the time of pickup;written on Rx

## 2015-10-15 ENCOUNTER — Other Ambulatory Visit (INDEPENDENT_AMBULATORY_CARE_PROVIDER_SITE_OTHER): Payer: Medicare Other

## 2015-10-15 DIAGNOSIS — E538 Deficiency of other specified B group vitamins: Secondary | ICD-10-CM

## 2015-10-15 LAB — VITAMIN B12: Vitamin B-12: 1001 pg/mL — ABNORMAL HIGH (ref 211–911)

## 2015-10-19 ENCOUNTER — Telehealth: Payer: Self-pay | Admitting: *Deleted

## 2015-10-19 NOTE — Telephone Encounter (Signed)
Discussed with Dr. Lindi Adie. Called daughter and patient is no longer coughing blood tinged mucous. Advised to monitor patient and if continues then see an ENT or her primary. Daughter verbalized understanding.

## 2015-10-19 NOTE — Telephone Encounter (Signed)
VM message received from pt's daughter '@[phone]'$  am. Return call made and spoke with daughter,  Jackelyn Poling.  Jackelyn Poling states that this morning pt coughed up some mucous and it was noted to have 'stringy blood' in the mucous. This happened 3  Times. Difficult for Debbie to quantitate, just said 'strings'.  Denies, fever, nausea, vomiting. Drinking good amount of fluids. Denies pain or any type.  Shortness of breath @ baseline.  Debbie states she wasn't sure who to call-pt's oncologist, pcp or throat doctor (pt has h/o throat cancer)  Please advise. Call daughter on cell @ [phone]

## 2015-10-21 ENCOUNTER — Telehealth: Payer: Self-pay | Admitting: Radiology

## 2015-10-21 ENCOUNTER — Other Ambulatory Visit: Payer: Self-pay | Admitting: Primary Care

## 2015-10-21 ENCOUNTER — Encounter: Payer: Self-pay | Admitting: Primary Care

## 2015-10-21 ENCOUNTER — Ambulatory Visit (INDEPENDENT_AMBULATORY_CARE_PROVIDER_SITE_OTHER): Payer: Medicare Other | Admitting: Primary Care

## 2015-10-21 ENCOUNTER — Ambulatory Visit (INDEPENDENT_AMBULATORY_CARE_PROVIDER_SITE_OTHER)
Admission: RE | Admit: 2015-10-21 | Discharge: 2015-10-21 | Disposition: A | Discharge status: Home / Self Care | Payer: Medicare Other | Source: Ambulatory Visit | Attending: Primary Care | Admitting: Primary Care

## 2015-10-21 VITALS — BP 136/70 | HR 84 | Temp 97.7°F | Ht 65.0 in | Wt 91.1 lb

## 2015-10-21 DIAGNOSIS — R042 Hemoptysis: Secondary | ICD-10-CM

## 2015-10-21 DIAGNOSIS — J189 Pneumonia, unspecified organism: Secondary | ICD-10-CM

## 2015-10-21 LAB — BASIC METABOLIC PANEL
BUN: 19 mg/dL (ref 6–23)
CO2: 31 mEq/L (ref 19–32)
Calcium: 10.1 mg/dL (ref 8.4–10.5)
Chloride: 98 mEq/L (ref 96–112)
Creatinine, Ser: 0.79 mg/dL (ref 0.40–1.20)
GFR: 73.97 mL/min (ref >60.00)
Glucose, Bld: 119 mg/dL — ABNORMAL HIGH (ref 70–99)
POTASSIUM: 4.3 meq/L (ref 3.5–5.1)
SODIUM: 137 meq/L (ref 135–145)

## 2015-10-21 LAB — CBC WITH DIFFERENTIAL/PLATELET
BASOS PCT: 0 % (ref 0.0–3.0)
Basophils Absolute: 0 10*3/uL (ref 0.0–0.1)
EOS ABS: 0 10*3/uL (ref 0.0–0.7)
Eosinophils Relative: 0.2 % (ref 0.0–5.0)
HCT: 39.1 % (ref 36.0–46.0)
Hemoglobin: 12.6 g/dL (ref 12.0–15.0)
LYMPHS ABS: 0.7 10*3/uL (ref 0.7–4.0)
Lymphocytes Relative: 3.3 % — ABNORMAL LOW (ref 12.0–46.0)
MCHC: 32.3 g/dL (ref 30.0–36.0)
MCV: 92.8 fl (ref 78.0–100.0)
MONO ABS: 1.3 10*3/uL — AB (ref 0.1–1.0)
Monocytes Relative: 6.3 % (ref 3.0–12.0)
NEUTROS ABS: 18.6 10*3/uL — AB (ref 1.4–7.7)
Neutrophils Relative %: 90.2 % — ABNORMAL HIGH (ref 43.0–77.0)
PLATELETS: 476 10*3/uL — AB (ref 150.0–400.0)
RBC: 4.22 Mil/uL (ref 3.87–5.11)
RDW: 13.4 % (ref 11.5–15.5)
WBC: 20.7 10*3/uL (ref 4.0–10.5)

## 2015-10-21 MED ORDER — LEVOFLOXACIN 500 MG PO TABS: 500.0000 mg | ORAL_TABLET | Freq: Every day | ORAL | Status: DC

## 2015-10-21 NOTE — Telephone Encounter (Signed)
Noted. Patient initiated on Levaquin for pneumonia.

## 2015-10-21 NOTE — Progress Notes (Signed)
Pre visit review using our clinic review tool, if applicable. No additional management support is needed unless otherwise documented below in the visit note. 

## 2015-10-21 NOTE — Telephone Encounter (Signed)
[facility] called critical WBC results, WBC 20.7. They are doing  a manuel diff. Results given to Alma Friendly, NP

## 2015-10-21 NOTE — Patient Instructions (Addendum)
Complete lab work prior to leaving today.   Complete xray(s) prior to leaving today.   I will be in touch later today with her results and treatment.  Take tylenol 500 mg every 6 hours as needed for fevers, chills, sweats.  It was a pleasure meeting you!

## 2015-10-21 NOTE — Progress Notes (Signed)
Subjective:    Patient ID: Veronica Pierce, female    DOB: 1931/01/07, 81 y.o.   MRN: [id number]  HPI  [name] is an 81 year old female who presents today with a chief complaint of cough. Her symptoms also include hemoptysis, chills, sweats, shortness of breath, fatigue. Her symptoms have been present for the past several weeks and the hemoptysis was first noted on Tuesday this week. The hemoptysis is "string-like". She's not taken anything OTC for her symptoms. She takes tylenol daily with her percocet.   Review of Systems  Constitutional: Positive for chills and fatigue. Negative for fever.  HENT: Negative for congestion, postnasal drip, rhinorrhea, sinus pressure and sore throat.   Respiratory: Positive for cough, shortness of breath and wheezing.   Cardiovascular: Negative for chest pain.  Gastrointestinal: Negative for nausea.  Musculoskeletal: Positive for myalgias.       Past Medical History  Diagnosis Date  . Throat cancer ([place]) 2010  . Breast cancer ([place]) 2010    left   . Lung cancer ([place]) 2012  . Breast cancer ([place]) 2013    right  . Hypothyroidism   . HTN (hypertension)     Social History   Social History  . Marital Status: Married    Spouse Name: N/A  . Number of Children: N/A  . Years of Education: N/A   Occupational History  . Not on file.   Social History Main Topics  . Smoking status: Never Smoker   . Smokeless tobacco: Never Used  . Alcohol Use: No  . Drug Use: No  . Sexual Activity: Not Currently   Other Topics Concern  . Not on file   Social History Narrative   Recently moved with here with husband to [facility].   Daughter lives here and is my patient.    Past Surgical History  Procedure Laterality Date  . Mastectomy Bilateral     2010, 2013  . Mastectomy  bilateral  . Back surgery    . Portacath placement    . Port-a-cath removal    . Cholecystectomy N/A 01/28/2014    Procedure: LAPAROSCOPIC CHOLECYSTECTOMY;  Surgeon: Rolm Bookbinder, MD;  Location: [facility] OR;  Service: General;  Laterality: N/A;    Family History  Problem Relation Age of Onset  . Diabetes Mother   . Arthritis Father     No Known Allergies  Current Outpatient Prescriptions on File Prior to Visit  Medication Sig Dispense Refill  . acetaminophen (TYLENOL) 325 MG tablet Take 650 mg by mouth every 6 (six) hours as needed. Take 1 tablet with oxycodone 2x a day    . alendronate (FOSAMAX) 35 MG tablet TAKE 1 TABLET BY MOUTH EVERY 7 DAYS. TAKE WITH A FULL GLASS OF WATER ON AN EMPTY STOMACH 48 tablet 0  . calcium gluconate 500 MG tablet Take 1,000 mg by mouth daily.    . cetirizine (ZYRTEC) 5 MG tablet Take 1 tablet (5 mg total) by mouth daily. 30 tablet   . Cyanocobalamin (VITAMIN B 12 PO) Take 1 tablet by mouth daily.    Marland Kitchen guaifenesin (HUMIBID E) 400 MG TABS tablet Take 400 mg by mouth daily as needed (congestion.).    Marland Kitchen HYDROcodone-acetaminophen (NORCO/VICODIN) 5-325 MG per tablet   0  . ibuprofen (ADVIL,MOTRIN) 200 MG tablet Take 200 mg by mouth every 6 (six) hours as needed.    . Ipratropium-Albuterol (COMBIVENT) 20-100 MCG/ACT AERS respimat Inhale 1 puff into the lungs every 6 (six) hours. 3 Inhaler  6  . letrozole (FEMARA) 2.5 MG tablet TAKE 1 TABLET (2.5 MG TOTAL) BY MOUTH DAILY. 90 tablet 3  . levothyroxine (SYNTHROID, LEVOTHROID) 100 MCG tablet TAKE 1 TABLET (100 MCG TOTAL) BY MOUTH DAILY BEFORE BREAKFAST. 30 tablet 11  . oxyCODONE (OXY IR/ROXICODONE) 5 MG immediate release tablet Take 1 tablet (5 mg total) by mouth 2 (two) times daily as needed for severe pain. 60 tablet 0  . pilocarpine (SALAGEN) 5 MG tablet TAKE 1 TABLET (5 MG TOTAL) BY MOUTH EVERY 12 (TWELVE) HOURS. 60 tablet 1   No current facility-administered medications on file prior to visit.    BP 136/70 mmHg  Pulse 84  Temp(Src) 97.7 F (36.5 C) (Oral)  Ht '5\' 5"'$  (1.651 m)  Wt 91 lb 1.9 oz (41.332 kg)  BMI 15.16 kg/m2  SpO2 95%    Objective:   Physical Exam    Constitutional: She is oriented to person, place, and time.  Neck: Neck supple.  Cardiovascular: Normal rate.  An irregularly irregular rhythm present.  Pulmonary/Chest: She has wheezes in the right upper field, the right lower field, the left upper field and the left lower field. She has rhonchi in the right lower field and the left lower field.  Lymphadenopathy:    She has no cervical adenopathy.  Neurological: She is alert and oriented to person, place, and time.  Skin: Skin is warm. She is diaphoretic.  Psychiatric: She has a normal mood and affect.          Assessment & Plan:  Hemoptysis:  No evidence during exam; however exam is quite convincing for pneumonia or respiratory process. Diaphoretic during exam with mild wheezing and rhonchi throughout. She is in no distress. Xray and blood work pending. Will treat empirically with antibiotic therapy once xray has returned. Fluids, rest, antibiotic therapy.

## 2015-10-22 ENCOUNTER — Other Ambulatory Visit: Payer: Self-pay | Admitting: Primary Care

## 2015-10-22 DIAGNOSIS — J189 Pneumonia, unspecified organism: Secondary | ICD-10-CM

## 2015-10-29 ENCOUNTER — Emergency Department (HOSPITAL_COMMUNITY): Payer: Medicare Other

## 2015-10-29 ENCOUNTER — Other Ambulatory Visit (INDEPENDENT_AMBULATORY_CARE_PROVIDER_SITE_OTHER): Payer: Medicare Other

## 2015-10-29 ENCOUNTER — Encounter (HOSPITAL_COMMUNITY): Payer: Self-pay | Admitting: Emergency Medicine

## 2015-10-29 ENCOUNTER — Telehealth: Payer: Self-pay | Admitting: *Deleted

## 2015-10-29 ENCOUNTER — Inpatient Hospital Stay (HOSPITAL_COMMUNITY)
Admission: EM | Admit: 2015-10-29 | Discharge: 2015-11-02 | DRG: 871 | Disposition: A | Discharge status: Home / Self Care | Payer: Medicare Other | Attending: Family Medicine | Admitting: Family Medicine

## 2015-10-29 DIAGNOSIS — I10 Essential (primary) hypertension: Secondary | ICD-10-CM | POA: Diagnosis present

## 2015-10-29 DIAGNOSIS — C7801 Secondary malignant neoplasm of right lung: Secondary | ICD-10-CM | POA: Diagnosis present

## 2015-10-29 DIAGNOSIS — C7800 Secondary malignant neoplasm of unspecified lung: Secondary | ICD-10-CM | POA: Diagnosis not present

## 2015-10-29 DIAGNOSIS — I2699 Other pulmonary embolism without acute cor pulmonale: Secondary | ICD-10-CM | POA: Diagnosis present

## 2015-10-29 DIAGNOSIS — Z681 Body mass index (BMI) 19 or less, adult: Secondary | ICD-10-CM

## 2015-10-29 DIAGNOSIS — J189 Pneumonia, unspecified organism: Secondary | ICD-10-CM | POA: Diagnosis present

## 2015-10-29 DIAGNOSIS — Z79899 Other long term (current) drug therapy: Secondary | ICD-10-CM | POA: Diagnosis not present

## 2015-10-29 DIAGNOSIS — Z515 Encounter for palliative care: Secondary | ICD-10-CM | POA: Diagnosis present

## 2015-10-29 DIAGNOSIS — A419 Sepsis, unspecified organism: Secondary | ICD-10-CM | POA: Diagnosis not present

## 2015-10-29 DIAGNOSIS — J8410 Pulmonary fibrosis, unspecified: Secondary | ICD-10-CM

## 2015-10-29 DIAGNOSIS — Z9010 Acquired absence of unspecified breast and nipple: Secondary | ICD-10-CM

## 2015-10-29 DIAGNOSIS — C50911 Malignant neoplasm of unspecified site of right female breast: Secondary | ICD-10-CM | POA: Diagnosis present

## 2015-10-29 DIAGNOSIS — C50919 Malignant neoplasm of unspecified site of unspecified female breast: Secondary | ICD-10-CM | POA: Diagnosis present

## 2015-10-29 DIAGNOSIS — Z923 Personal history of irradiation: Secondary | ICD-10-CM

## 2015-10-29 DIAGNOSIS — R911 Solitary pulmonary nodule: Secondary | ICD-10-CM | POA: Diagnosis not present

## 2015-10-29 DIAGNOSIS — C50912 Malignant neoplasm of unspecified site of left female breast: Secondary | ICD-10-CM

## 2015-10-29 DIAGNOSIS — J984 Other disorders of lung: Secondary | ICD-10-CM

## 2015-10-29 DIAGNOSIS — Z9049 Acquired absence of other specified parts of digestive tract: Secondary | ICD-10-CM | POA: Diagnosis not present

## 2015-10-29 DIAGNOSIS — E43 Unspecified severe protein-calorie malnutrition: Secondary | ICD-10-CM | POA: Diagnosis present

## 2015-10-29 DIAGNOSIS — E039 Hypothyroidism, unspecified: Secondary | ICD-10-CM | POA: Diagnosis present

## 2015-10-29 DIAGNOSIS — R0602 Shortness of breath: Secondary | ICD-10-CM | POA: Diagnosis present

## 2015-10-29 DIAGNOSIS — Z833 Family history of diabetes mellitus: Secondary | ICD-10-CM

## 2015-10-29 DIAGNOSIS — R918 Other nonspecific abnormal finding of lung field: Secondary | ICD-10-CM | POA: Diagnosis present

## 2015-10-29 DIAGNOSIS — Z66 Do not resuscitate: Secondary | ICD-10-CM | POA: Diagnosis present

## 2015-10-29 LAB — EXPECTORATED SPUTUM ASSESSMENT W GRAM STAIN, RFLX TO RESP C

## 2015-10-29 LAB — URINALYSIS, ROUTINE W REFLEX MICROSCOPIC
Bilirubin Urine: NEGATIVE
Glucose, UA: NEGATIVE mg/dL
HGB URINE DIPSTICK: NEGATIVE
KETONES UR: NEGATIVE mg/dL
LEUKOCYTES UA: NEGATIVE
Nitrite: NEGATIVE
PROTEIN: NEGATIVE mg/dL
Specific Gravity, Urine: 1.034 — ABNORMAL HIGH (ref 1.005–1.030)
UROBILINOGEN UA: 1 mg/dL (ref 0.0–1.0)
pH: 7 (ref 5.0–8.0)

## 2015-10-29 LAB — EXPECTORATED SPUTUM ASSESSMENT W REFEX TO RESP CULTURE

## 2015-10-29 LAB — COMPREHENSIVE METABOLIC PANEL
ALT: 14 U/L (ref 14–54)
ANION GAP: 9 (ref 5–15)
AST: 18 U/L (ref 15–41)
Albumin: 3.1 g/dL — ABNORMAL LOW (ref 3.5–5.0)
Alkaline Phosphatase: 86 U/L (ref 38–126)
BUN: 24 mg/dL — ABNORMAL HIGH (ref 6–20)
CALCIUM: 9.1 mg/dL (ref 8.9–10.3)
CHLORIDE: 97 mmol/L — AB (ref 101–111)
CO2: 30 mmol/L (ref 22–32)
Creatinine, Ser: 0.94 mg/dL (ref 0.44–1.00)
GFR, EST NON AFRICAN AMERICAN: 55 mL/min — AB (ref >60)
Glucose, Bld: 99 mg/dL (ref 65–99)
Potassium: 4 mmol/L (ref 3.5–5.1)
SODIUM: 136 mmol/L (ref 135–145)
Total Bilirubin: 0.6 mg/dL (ref 0.3–1.2)
Total Protein: 7.9 g/dL (ref 6.5–8.1)

## 2015-10-29 LAB — CBC WITH DIFFERENTIAL/PLATELET
BASOS ABS: 0 10*3/uL (ref 0.0–0.1)
Basophils Absolute: 0 10*3/uL (ref 0.0–0.1)
Basophils Relative: 0 % (ref 0.0–3.0)
Basophils Relative: 0 %
EOS ABS: 0.2 10*3/uL (ref 0.0–0.7)
EOS PCT: 1 %
Eosinophils Absolute: 0 10*3/uL (ref 0.0–0.7)
Eosinophils Relative: 0.2 % (ref 0.0–5.0)
HCT: 35.3 % — ABNORMAL LOW (ref 36.0–46.0)
HCT: 34 % — ABNORMAL LOW (ref 36.0–46.0)
Hemoglobin: 11.1 g/dL — ABNORMAL LOW (ref 12.0–15.0)
Hemoglobin: 11 g/dL — ABNORMAL LOW (ref 12.0–15.0)
LYMPHS ABS: 0.6 10*3/uL — AB (ref 0.7–4.0)
LYMPHS ABS: 1.3 10*3/uL (ref 0.7–4.0)
Lymphocytes Relative: 2.5 % — ABNORMAL LOW (ref 12.0–46.0)
Lymphocytes Relative: 7 %
MCH: 30.1 pg (ref 26.0–34.0)
MCHC: 31.6 g/dL (ref 30.0–36.0)
MCHC: 32.4 g/dL (ref 30.0–36.0)
MCV: 92.8 fl (ref 78.0–100.0)
MCV: 92.9 fL (ref 78.0–100.0)
MONO ABS: 1.2 10*3/uL — AB (ref 0.1–1.0)
MONO ABS: 1.1 10*3/uL — AB (ref 0.1–1.0)
MONOS PCT: 6 %
Monocytes Relative: 5.2 % (ref 3.0–12.0)
NEUTROS ABS: 21.7 10*3/uL — AB (ref 1.4–7.7)
Neutro Abs: 16.9 10*3/uL — ABNORMAL HIGH (ref 1.7–7.7)
Neutrophils Relative %: 86 %
PLATELETS: 409 10*3/uL — AB (ref 150.0–400.0)
PLATELETS: 361 10*3/uL (ref 150–400)
RBC: 3.8 Mil/uL — AB (ref 3.87–5.11)
RBC: 3.66 MIL/uL — ABNORMAL LOW (ref 3.87–5.11)
RDW: 13.4 % (ref 11.5–15.5)
RDW: 13.1 % (ref 11.5–15.5)
WBC: 23.6 10*3/uL (ref 4.0–10.5)
WBC: 19.6 10*3/uL — ABNORMAL HIGH (ref 4.0–10.5)

## 2015-10-29 LAB — PROTIME-INR
INR: 1.24 (ref 0.00–1.49)
Prothrombin Time: 15.8 seconds — ABNORMAL HIGH (ref 11.6–15.2)

## 2015-10-29 LAB — APTT: APTT: 43 s — AB (ref 24–37)

## 2015-10-29 LAB — I-STAT CG4 LACTIC ACID, ED: LACTIC ACID, VENOUS: 1.4 mmol/L (ref 0.5–2.0)

## 2015-10-29 LAB — HEPARIN LEVEL (UNFRACTIONATED)

## 2015-10-29 MED ORDER — CHLORHEXIDINE GLUCONATE 0.12 % MT SOLN
15.0000 mL | Freq: Two times a day (BID) | OROMUCOSAL | Status: DC
  Administered 2015-10-30 – 2015-11-01 (×6): 15 mL via OROMUCOSAL
  Filled 2015-10-29 (×5): qty 15

## 2015-10-29 MED ORDER — HEPARIN BOLUS VIA INFUSION
2000.0000 [IU] | INTRAVENOUS | Status: DC
  Filled 2015-10-29: qty 2000

## 2015-10-29 MED ORDER — IPRATROPIUM-ALBUTEROL 0.5-2.5 (3) MG/3ML IN SOLN
3.0000 mL | Freq: Four times a day (QID) | RESPIRATORY_TRACT | Status: DC
  Administered 2015-10-29: 3 mL via RESPIRATORY_TRACT
  Filled 2015-10-29: qty 3

## 2015-10-29 MED ORDER — ENSURE ENLIVE PO LIQD
237.0000 mL | Freq: Two times a day (BID) | ORAL | Status: DC
  Administered 2015-10-30 – 2015-11-02 (×6): 237 mL via ORAL

## 2015-10-29 MED ORDER — GUAIFENESIN 200 MG PO TABS
400.0000 mg | ORAL_TABLET | Freq: Every day | ORAL | Status: DC | PRN
  Administered 2015-10-30 – 2015-10-31 (×2): 400 mg via ORAL
  Filled 2015-10-29 (×4): qty 2

## 2015-10-29 MED ORDER — PILOCARPINE HCL 5 MG PO TABS
5.0000 mg | ORAL_TABLET | Freq: Two times a day (BID) | ORAL | Status: DC
  Administered 2015-10-30 – 2015-11-02 (×7): 5 mg via ORAL
  Filled 2015-10-29 (×10): qty 1

## 2015-10-29 MED ORDER — LETROZOLE 2.5 MG PO TABS
2.5000 mg | ORAL_TABLET | Freq: Every day | ORAL | Status: DC
Start: 2015-10-30 — End: 2015-11-02
  Administered 2015-10-30 – 2015-11-02 (×4): 2.5 mg via ORAL
  Filled 2015-10-29 (×5): qty 1

## 2015-10-29 MED ORDER — ACETAMINOPHEN 325 MG PO TABS
650.0000 mg | ORAL_TABLET | Freq: Two times a day (BID) | ORAL | Status: DC
  Administered 2015-10-29 – 2015-11-02 (×8): 650 mg via ORAL
  Filled 2015-10-29 (×9): qty 2

## 2015-10-29 MED ORDER — HEPARIN (PORCINE) IN NACL 100-0.45 UNIT/ML-% IJ SOLN
750.0000 [IU]/h | INTRAMUSCULAR | Status: DC
  Administered 2015-10-29: 750 [IU]/h via INTRAVENOUS
  Filled 2015-10-29: qty 250

## 2015-10-29 MED ORDER — PIPERACILLIN-TAZOBACTAM 3.375 G IVPB
3.3750 g | Freq: Three times a day (TID) | INTRAVENOUS | Status: DC
  Administered 2015-10-30 – 2015-11-02 (×10): 3.375 g via INTRAVENOUS
  Filled 2015-10-29 (×10): qty 50

## 2015-10-29 MED ORDER — OXYCODONE HCL 5 MG PO TABS
5.0000 mg | ORAL_TABLET | Freq: Two times a day (BID) | ORAL | Status: DC | PRN
  Administered 2015-10-29 – 2015-11-02 (×8): 5 mg via ORAL
  Filled 2015-10-29 (×8): qty 1

## 2015-10-29 MED ORDER — IBUPROFEN 200 MG PO TABS: 200.0000 mg | ORAL_TABLET | Freq: Four times a day (QID) | ORAL | Status: DC | PRN

## 2015-10-29 MED ORDER — VANCOMYCIN HCL IN DEXTROSE 750-5 MG/150ML-% IV SOLN
750.0000 mg | Freq: Once | INTRAVENOUS | Status: AC
  Administered 2015-10-29: 750 mg via INTRAVENOUS
  Filled 2015-10-29: qty 150

## 2015-10-29 MED ORDER — CETYLPYRIDINIUM CHLORIDE 0.05 % MT LIQD
7.0000 mL | Freq: Two times a day (BID) | OROMUCOSAL | Status: DC
  Administered 2015-10-31: 7 mL via OROMUCOSAL

## 2015-10-29 MED ORDER — VANCOMYCIN HCL 500 MG IV SOLR
500.0000 mg | INTRAVENOUS | Status: DC
  Administered 2015-10-30 – 2015-11-01 (×3): 500 mg via INTRAVENOUS
  Filled 2015-10-29 (×3): qty 500

## 2015-10-29 MED ORDER — VITAMINS A & D EX OINT
TOPICAL_OINTMENT | CUTANEOUS | Status: AC
  Administered 2015-10-29: 22:00:00
  Filled 2015-10-29: qty 5

## 2015-10-29 MED ORDER — LORAZEPAM 0.5 MG PO TABS
0.5000 mg | ORAL_TABLET | Freq: Once | ORAL | Status: AC
  Administered 2015-10-29: 0.5 mg via ORAL
  Filled 2015-10-29: qty 1

## 2015-10-29 MED ORDER — PIPERACILLIN-TAZOBACTAM 3.375 G IVPB
3.3750 g | Freq: Once | INTRAVENOUS | Status: AC
  Administered 2015-10-29: 3.375 g via INTRAVENOUS
  Filled 2015-10-29: qty 50

## 2015-10-29 MED ORDER — IPRATROPIUM-ALBUTEROL 0.5-2.5 (3) MG/3ML IN SOLN
3.0000 mL | Freq: Four times a day (QID) | RESPIRATORY_TRACT | Status: DC
  Administered 2015-10-30 – 2015-11-01 (×9): 3 mL via RESPIRATORY_TRACT
  Filled 2015-10-29 (×9): qty 3

## 2015-10-29 MED ORDER — LEVOTHYROXINE SODIUM 100 MCG PO TABS
100.0000 ug | ORAL_TABLET | Freq: Every day | ORAL | Status: DC
  Administered 2015-10-30 – 2015-11-02 (×4): 100 ug via ORAL
  Filled 2015-10-29 (×6): qty 1

## 2015-10-29 MED ORDER — IOHEXOL 350 MG/ML SOLN
100.0000 mL | Freq: Once | INTRAVENOUS | Status: AC | PRN
  Administered 2015-10-29: 100 mL via INTRAVENOUS

## 2015-10-29 NOTE — Telephone Encounter (Signed)
Noted. Called patient an told her to go to the emergency department. She is going to [facility] ED now.. Report called to [place] charge nurse at [facility].

## 2015-10-29 NOTE — Progress Notes (Signed)
ANTIBIOTIC CONSULT NOTE - INITIAL  Pharmacy Consult for Vancomycin, Zosyn  Indication: pneumonia  No Known Allergies  Patient Measurements:     Vital Signs: Temp Source: Oral (11/04 1606) BP: 145/85 mmHg (11/04 1755) Pulse Rate: 79 (11/04 1755) Intake/Output from previous day:   Intake/Output from this shift:    Labs:  Recent Labs  10/29/15 1114 10/29/15 1715  WBC 23.6 Repeated and verified X2.* 19.6*  HGB 11.1* 11.0*  PLT 409.0* 361  CREATININE  --  0.94   Estimated Creatinine Clearance: 30.1 mL/min (by C-G formula based on Cr of 0.94). No results for input(s): VANCOTROUGH, VANCOPEAK, VANCORANDOM, GENTTROUGH, GENTPEAK, GENTRANDOM, TOBRATROUGH, TOBRAPEAK, TOBRARND, AMIKACINPEAK, AMIKACINTROU, AMIKACIN in the last 72 hours.   Microbiology: No results found for this or any previous visit (from the past 720 hour(s)).  Medical History: Past Medical History  Diagnosis Date  . Throat cancer ([place]) 2010  . Breast cancer ([place]) 2010    left   . Lung cancer ([place]) 2012  . Breast cancer ([place]) 2013    right  . Hypothyroidism   . HTN (hypertension)     Assessment: 58 yoF known to pharmacy for heparin dosing for PE.  Now being consulted to dose vancomycin and zosyn for pneumonia. CXR suggestive of acute cavitary pneumonia.  Zosyn 3.375g x 1 ordered in ED.  - WBC elevated - CrCl~29 ml/min  Goal of Therapy:  Vancomycin trough level 15-20 mcg/ml  Doses adjusted per renal function Eradication of infection  Plan:  1.  Vancomycin 750 mg IV x 1 then 500 mg q24h. 2.  Zosyn 3.375g IV q8h (4 hour infusion time).  3.  F/u SCr, vanc trough levels, clinical course.  Hershal Coria 10/29/2015,7:51 PM

## 2015-10-29 NOTE — ED Provider Notes (Signed)
CSN: [id number]     Arrival date & time 10/29/15  1548 History   First MD Initiated Contact with Patient 10/29/15 1658     Chief Complaint  Patient presents with  . PNA   . abnormal labs      (Consider location/radiation/quality/duration/timing/severity/associated sxs/prior Treatment) Patient is a 81 y.o. female presenting with shortness of breath. The history is provided by the patient and a relative.  Shortness of Breath Severity:  Moderate Onset quality:  Gradual Duration: multiple months. Timing:  Constant Progression:  Unchanged Chronicity:  New Relieved by:  Nothing Worsened by:  Nothing tried Ineffective treatments:  None tried Associated symptoms: hemoptysis (resolved)   Associated symptoms: no cough and no fever   Risk factors: hx of cancer (4+ y/a, breast, throat and lung lesions)   Risk factors comment:  Elderly   Past Medical History  Diagnosis Date  . Throat cancer ([place]) 2010  . Breast cancer ([place]) 2010    left   . Lung cancer ([place]) 2012  . Breast cancer ([place]) 2013    right  . Hypothyroidism   . HTN (hypertension)    Past Surgical History  Procedure Laterality Date  . Mastectomy Bilateral     2010, 2013  . Mastectomy  bilateral  . Back surgery    . Portacath placement    . Port-a-cath removal    . Cholecystectomy N/A 01/28/2014    Procedure: LAPAROSCOPIC CHOLECYSTECTOMY;  Surgeon: Rolm Bookbinder, MD;  Location: [facility] OR;  Service: General;  Laterality: N/A;   Family History  Problem Relation Age of Onset  . Diabetes Mother   . Arthritis Father    Social History  Substance Use Topics  . Smoking status: Never Smoker   . Smokeless tobacco: Never Used  . Alcohol Use: No   OB History    No data available     Review of Systems  Constitutional: Negative for fever.  Respiratory: Positive for hemoptysis (resolved) and shortness of breath. Negative for cough.   All other systems reviewed and are negative.     Allergies  Review of patient's  allergies indicates no known allergies.  Home Medications   Prior to Admission medications   Medication Sig Start Date End Date Taking? Authorizing Provider  acetaminophen (TYLENOL) 325 MG tablet Take 650 mg by mouth 2 (two) times daily. Take 1 tablet with oxycodone 2x a day   Yes Historical Provider, MD  alendronate (FOSAMAX) 35 MG tablet TAKE 1 TABLET BY MOUTH EVERY 7 DAYS. TAKE WITH A FULL GLASS OF WATER ON AN EMPTY STOMACH Patient taking differently: TAKE 1 TABLET BY MOUTH EVERY 7 DAYS. TAKE WITH A FULL GLASS OF WATER ON AN EMPTY STOMACH-Tuesday 03/31/15  Yes Nicholas Lose, MD  cetirizine (ZYRTEC) 5 MG tablet Take 1 tablet (5 mg total) by mouth daily. 03/09/15  Yes Lucille Passy, MD  Cyanocobalamin (VITAMIN B 12 PO) Take 1 tablet by mouth daily.   Yes Historical Provider, MD  Doxylamine Succinate, Sleep, (SLEEP AID PO) Take 1 tablet by mouth at bedtime as needed (sleep).   Yes Historical Provider, MD  guaifenesin (HUMIBID E) 400 MG TABS tablet Take 400 mg by mouth daily as needed (congestion.).   Yes Historical Provider, MD  ibuprofen (ADVIL,MOTRIN) 200 MG tablet Take 200 mg by mouth every 6 (six) hours as needed for moderate pain.    Yes Historical Provider, MD  Ipratropium-Albuterol (COMBIVENT) 20-100 MCG/ACT AERS respimat Inhale 1 puff into the lungs every 6 (six) hours. 05/14/15  Yes Nicholas Lose, MD  letrozole (FEMARA) 2.5 MG tablet TAKE 1 TABLET (2.5 MG TOTAL) BY MOUTH DAILY. 10/07/15  Yes Nicholas Lose, MD  levofloxacin (LEVAQUIN) 500 MG tablet Take 1 tablet (500 mg total) by mouth daily. 10/21/15  Yes Pleas Koch, NP  levothyroxine (SYNTHROID, LEVOTHROID) 100 MCG tablet TAKE 1 TABLET (100 MCG TOTAL) BY MOUTH DAILY BEFORE BREAKFAST. 04/02/15  Yes Lucille Passy, MD  oxyCODONE (OXY IR/ROXICODONE) 5 MG immediate release tablet Take 1 tablet (5 mg total) by mouth 2 (two) times daily as needed for severe pain. 10/11/15  Yes Lucille Passy, MD  pilocarpine (SALAGEN) 5 MG tablet TAKE 1 TABLET (5 MG  TOTAL) BY MOUTH EVERY 12 (TWELVE) HOURS. 09/21/15  Yes Lucille Passy, MD   Pulse 90  Resp 18  SpO2 100% Physical Exam  Constitutional: She is oriented to person, place, and time. She appears well-developed. She appears cachectic. No distress.  HENT:  Head: Normocephalic.  Eyes: Conjunctivae are normal.  Neck: Neck supple. No tracheal deviation present.  Cardiovascular: Normal rate and regular rhythm.   Pulmonary/Chest: Effort normal. No respiratory distress. She has no decreased breath sounds. She has rales (diffuse bilateral).  Abdominal: Soft. She exhibits no distension.  Neurological: She is alert and oriented to person, place, and time.  Skin: Skin is warm and dry.  Psychiatric: She has a normal mood and affect.    ED Course  Procedures (including critical care time)  CRITICAL CARE Performed by: Leo Grosser Total critical care time: 30 minutes Critical care time was exclusive of separately billable procedures and treating other patients. Critical care was necessary to treat or prevent imminent or life-threatening deterioration. Critical care was time spent personally by me on the following activities: development of treatment plan with patient and/or surrogate as well as nursing, discussions with consultants, evaluation of patient's response to treatment, examination of patient, obtaining history from patient or surrogate, ordering and performing treatments and interventions, ordering and review of laboratory studies, ordering and review of radiographic studies, pulse oximetry and re-evaluation of patient's condition.   Labs Review Labs Reviewed  CBC WITH DIFFERENTIAL/PLATELET - Abnormal; Notable for the following:    WBC 19.6 (*)    RBC 3.66 (*)    Hemoglobin 11.0 (*)    HCT 34.0 (*)    Neutro Abs 16.9 (*)    Monocytes Absolute 1.1 (*)    All other components within normal limits  COMPREHENSIVE METABOLIC PANEL - Abnormal; Notable for the following:    Chloride 97 (*)     BUN 24 (*)    Albumin 3.1 (*)    GFR calc non Af Amer 55 (*)    All other components within normal limits  URINALYSIS, ROUTINE W REFLEX MICROSCOPIC (NOT AT [facility]) - Abnormal; Notable for the following:    Specific Gravity, Urine 1.034 (*)    All other components within normal limits  APTT - Abnormal; Notable for the following:    aPTT 43 (*)    All other components within normal limits  PROTIME-INR - Abnormal; Notable for the following:    Prothrombin Time 15.8 (*)    All other components within normal limits  HEPARIN LEVEL (UNFRACTIONATED) - Abnormal; Notable for the following:    Heparin Unfractionated <0.10 (*)    All other components within normal limits  CULTURE, EXPECTORATED SPUTUM-ASSESSMENT  CULTURE, BLOOD (ROUTINE X 2)  CULTURE, BLOOD (ROUTINE X 2)  CULTURE, EXPECTORATED SPUTUM-ASSESSMENT  STREP PNEUMONIAE URINARY ANTIGEN  LEGIONELLA PNEUMOPHILA  SEROGP 1 UR AG  HEPARIN LEVEL (UNFRACTIONATED)  I-STAT CG4 LACTIC ACID, ED    Imaging Review Ct Angio Chest Pe W/cm &/or Wo Cm  10/29/2015  CLINICAL DATA:  81 year old female with increasing shortness of breath and weakness. History of left-sided breast cancer diagnosed in 2010 and lung cancer diagnosed 2012, with recurrent right-sided breast cancer diagnosed again in 2013. EXAM: CT ANGIOGRAPHY CHEST WITH CONTRAST TECHNIQUE: Multidetector CT imaging of the chest was performed using the standard protocol during bolus administration of intravenous contrast. Multiplanar CT image reconstructions and MIPs were obtained to evaluate the vascular anatomy. CONTRAST:  182m OMNIPAQUE IOHEXOL 350 MG/ML SOLN COMPARISON:  PET-CT 01/11/2015. FINDINGS: Mediastinum/Lymph Nodes: Image 835of series 10 demonstrates a nonocclusive filling defect in a segmental sized branch to the left upper lobe. No other larger more central filling defects are noted. Heart size is normal. There is no significant pericardial fluid, thickening or pericardial calcification.  Enlarged subcarinal lymph node measuring 1.4 cm in short axis. Esophagus is unremarkable in appearance. Slight rightward shift of all cardiomediastinal structures related to chronic right-sided volume loss. No axillary lymphadenopathy. Numerous surgical clips in the left axilla, presumably from prior lymph node dissection. Lungs/Pleura: Severe chronic fibrotic changes again noted throughout the right lung where there is widespread varicose and cystic bronchiectasis. The overall appearance of the right hemithorax is most compatible with chronic fibrothorax, including the presence of a chronic pneumothorax which is notable in the right apex, only slightly larger than remote prior study 01/11/2015. Extensive consolidative changes in the right lower lobe are noted, where there is a new large cavitary area measuring 4.2 x 3.9 cm (image 53 of series 11). Small right pleural effusion. Fibrotic changes in the apex of the left upper lobe and anterior aspect of the left upper lobe, presumably related to prior left-sided breast and axilla radiation therapy. Pleural-based sessile nodule in the medial aspect of the left lower lobe (image 70 of series 4) new compared to the prior study, measuring 2.9 x 1.2 cm. Upper Abdomen: Large 3.9 x 6.8 cm low-attenuation lesion in the superior aspect of the spleen appears progressively increased in size compared to prior examinations, suspicious for a cystic metastasis. Musculoskeletal/Soft Tissues: Chronic compression fracture of L1 with post vertebroplasty changes. Old healed fractures of the lateral aspect of the right seventh and eighth ribs. There are no aggressive appearing lytic or blastic lesions noted in the visualized portions of the skeleton. Review of the MIP images confirms the above findings. IMPRESSION: 1. Small nonobstructive segmental sized embolus to the left upper lobe. 2. Progressively worsening right-sided fibrothorax with widespread varicose and cystic bronchiectasis  throughout the right lung, including a new large 4.2 x 3.9 cm cystic area in the right lower lobe in the midst of an area of consolidation. Clinical correlation for signs and symptoms of acute cavitary pneumonia is recommended. There is also small right-sided parapneumonic pleural effusion. 3. New sessile pleural-based nodule measuring 2.9 x 1.2 cm in the medial left lower lobe, concerning for potential neoplasm (either primary neoplasm or metastatic). 4. Enlarging cystic lesion in the spleen concerning for potential cystic splenic metastasis. 5. Additional incidental findings, as above. Electronically Signed   By: DVinnie LangtonM.D.   On: 10/29/2015 18:30   I have personally reviewed and evaluated these images and lab results as part of my medical decision-making.   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 female presents with ongoing shortness of breath, clinical worsening since recent diagnosis in the last month with pneumonia. Continuing to have fevers intermittently, was told she had leukocytosis on outside lab draw and was instructed to come for further workup. Pt is high risk for PE given oncologic status and stasis. Not anticoagulated. Has PE on CT requiring anticoagulation. Has another finding of likely cavitary pneumonia, ongoing fibrosis and lung nodules. IV ABx broadened to cover HCAP organisms and will require admission for further workup and management. Hospitalist was consulted for admission and will see the patient in the emergency department.   Leo Grosser, MD 10/30/15 [phone]

## 2015-10-29 NOTE — Telephone Encounter (Signed)
Critical labs WBC 23.6. Results are in EPIC, hardcopy given to [facility].

## 2015-10-29 NOTE — ED Notes (Signed)
Per family- states diagnosed with PNA in October-states she had blood work drawn today-patient was called and to come to ED ASAP

## 2015-10-29 NOTE — Progress Notes (Signed)
ANTICOAGULATION CONSULT NOTE - Initial Consult  Pharmacy Consult for Heparin Indication: pulmonary embolus  No Known Allergies  Patient Measurements:   Heparin Dosing Weight: 41.3  Vital Signs: Temp Source: Oral (11/04 1606) BP: 145/85 mmHg (11/04 1755) Pulse Rate: 79 (11/04 1755)  Labs:  Recent Labs  10/29/15 1114 10/29/15 1715  HGB 11.1* 11.0*  HCT 35.3* 34.0*  PLT 409.0* 361  CREATININE  --  0.94    Estimated Creatinine Clearance: 30.1 mL/min (by C-G formula based on Cr of 0.94).   Medical History: Past Medical History  Diagnosis Date  . Throat cancer ([place]) 2010  . Breast cancer ([place]) 2010    left   . Lung cancer ([place]) 2012  . Breast cancer ([place]) 2013    right  . Hypothyroidism   . HTN (hypertension)     Medications:  Infusions:  . heparin    . piperacillin-tazobactam (ZOSYN)  IV      Assessment:  45 yoF presenting w/ SOB.  CT reveals nonocclusive filling defect in a segmental sized branch to the left upper lobe as well as signs of cavitary PNA.  Pharmacy to dose heparin   Baseline INR, aPTT: in process  Prior anticoagulation: none  Significant events:  Today, 10/29/2015:  CBC: Hgb sl low but appears stable; Plt wnl  ROS positive for hemoptysis which has resolved  CrCl: 30 ml/min (baseline)  Goal of Therapy: Heparin level 0.3-0.7 units/ml Monitor platelets by anticoagulation protocol: Yes  Plan:  Will omit bolus with nonocclusive clot and recent hemoptysis  Heparin 750 units/hr IV infusion  Check heparin level 8 hrs after start  Daily CBC, daily heparin level once stable  Monitor for signs of bleeding or thrombosis   Reuel Boom, PharmD Pager: [phone] 10/29/2015, 6:56 PM

## 2015-10-29 NOTE — H&P (Signed)
[facility] History and Physical  Gloris Shiroma KHT:[id number] DOB: 02-Sep-1931 DOA: 10/29/2015  Referring physician: EDP PCP: Arnette Norris, MD   Chief Complaint: SOB   HPI: Marialuiza Car is a 81 y.o. female with extensive PMH including BRCA with metastatic disease to her R lung, radiation therapy to R lung.  Patient presents to ED with c/o worsening SOB over the past month, has generalized weakness, sweating, cough productive of bloody sputum.  Recent outpatient work up was positive for leukocytosis and PNA.  She was treated with levaquin as outpatient but this has not improved symptoms other than to cause her hemoptysis to resolved.  Review of Systems: Systems reviewed.  As above, otherwise negative  Past Medical History  Diagnosis Date  . Throat cancer ([place]) 2010  . Breast cancer ([place]) 2010    left   . Lung cancer ([place]) 2012  . Breast cancer ([place]) 2013    right  . Hypothyroidism   . HTN (hypertension)    Past Surgical History  Procedure Laterality Date  . Mastectomy Bilateral     2010, 2013  . Mastectomy  bilateral  . Back surgery    . Portacath placement    . Port-a-cath removal    . Cholecystectomy N/A 01/28/2014    Procedure: LAPAROSCOPIC CHOLECYSTECTOMY;  Surgeon: Rolm Bookbinder, MD;  Location: [place];  Service: General;  Laterality: N/A;   Social History:  reports that she has never smoked. She has never used smokeless tobacco. She reports that she does not drink alcohol or use illicit drugs.  No Known Allergies  Family History  Problem Relation Age of Onset  . Diabetes Mother   . Arthritis Father      Prior to Admission medications   Medication Sig Start Date End Date Taking? Authorizing Provider  acetaminophen (TYLENOL) 325 MG tablet Take 650 mg by mouth 2 (two) times daily. Take 1 tablet with oxycodone 2x a day   Yes Historical Provider, MD  alendronate (FOSAMAX) 35 MG tablet TAKE 1 TABLET BY MOUTH EVERY 7 DAYS. TAKE WITH A FULL GLASS OF WATER ON AN EMPTY  STOMACH Patient taking differently: TAKE 1 TABLET BY MOUTH EVERY 7 DAYS. TAKE WITH A FULL GLASS OF WATER ON AN EMPTY STOMACH-Tuesday 03/31/15  Yes Nicholas Lose, MD  cetirizine (ZYRTEC) 5 MG tablet Take 1 tablet (5 mg total) by mouth daily. 03/09/15  Yes Lucille Passy, MD  Cyanocobalamin (VITAMIN B 12 PO) Take 1 tablet by mouth daily.   Yes Historical Provider, MD  Doxylamine Succinate, Sleep, (SLEEP AID PO) Take 1 tablet by mouth at bedtime as needed (sleep).   Yes Historical Provider, MD  guaifenesin (HUMIBID E) 400 MG TABS tablet Take 400 mg by mouth daily as needed (congestion.).   Yes Historical Provider, MD  ibuprofen (ADVIL,MOTRIN) 200 MG tablet Take 200 mg by mouth every 6 (six) hours as needed for moderate pain.    Yes Historical Provider, MD  Ipratropium-Albuterol (COMBIVENT) 20-100 MCG/ACT AERS respimat Inhale 1 puff into the lungs every 6 (six) hours. 05/14/15  Yes Nicholas Lose, MD  letrozole (FEMARA) 2.5 MG tablet TAKE 1 TABLET (2.5 MG TOTAL) BY MOUTH DAILY. 10/07/15  Yes Nicholas Lose, MD  levothyroxine (SYNTHROID, LEVOTHROID) 100 MCG tablet TAKE 1 TABLET (100 MCG TOTAL) BY MOUTH DAILY BEFORE BREAKFAST. 04/02/15  Yes Lucille Passy, MD  oxyCODONE (OXY IR/ROXICODONE) 5 MG immediate release tablet Take 1 tablet (5 mg total) by mouth 2 (two) times daily as needed for severe pain. 10/11/15  Yes Lucille Passy, MD  pilocarpine (SALAGEN) 5 MG tablet TAKE 1 TABLET (5 MG TOTAL) BY MOUTH EVERY 12 (TWELVE) HOURS. 09/21/15  Yes Lucille Passy, MD   Physical Exam: Filed Vitals:   10/29/15 1755  BP: 145/85  Pulse: 79  Resp: 22    BP 145/85 mmHg  Pulse 79  Resp 22  SpO2 100%  General Appearance:    Alert, oriented, no distress, appears stated age  Head:    Normocephalic, atraumatic  Eyes:    PERRL, EOMI, sclera non-icteric        Nose:   Nares without drainage or epistaxis. Mucosa, turbinates normal  Throat:   Moist mucous membranes. Oropharynx without erythema or exudate.  Neck:   Supple. No carotid  bruits.  No thyromegaly.  No lymphadenopathy.   Back:     No CVA tenderness, no spinal tenderness  Lungs:     Clear to auscultation bilaterally, without wheezes, rhonchi or rales  Chest wall:    No tenderness to palpitation  Heart:    Regular rate and rhythm without murmurs, gallops, rubs  Abdomen:     Soft, non-tender, nondistended, normal bowel sounds, no organomegaly  Genitalia:    deferred  Rectal:    deferred  Extremities:   No clubbing, cyanosis or edema.  Pulses:   2+ and symmetric all extremities  Skin:   Skin color, texture, turgor normal, no rashes or lesions  Lymph nodes:   Cervical, supraclavicular, and axillary nodes normal  Neurologic:   CNII-XII intact. Normal strength, sensation and reflexes      throughout    Labs on Admission:  Basic Metabolic Panel:  Recent Labs Lab 10/29/15 1715  NA 136  K 4.0  CL 97*  CO2 30  GLUCOSE 99  BUN 24*  CREATININE 0.94  CALCIUM 9.1   Liver Function Tests:  Recent Labs Lab 10/29/15 1715  AST 18  ALT 14  ALKPHOS 86  BILITOT 0.6  PROT 7.9  ALBUMIN 3.1*   No results for input(s): LIPASE, AMYLASE in the last 168 hours. No results for input(s): AMMONIA in the last 168 hours. CBC:  Recent Labs Lab 10/29/15 1114 10/29/15 1715  WBC 23.6 Repeated and verified X2.* 19.6*  NEUTROABS 21.7* 16.9*  HGB 11.1* 11.0*  HCT 35.3* 34.0*  MCV 92.8 92.9  PLT 409.0* 361   Cardiac Enzymes: No results for input(s): CKTOTAL, CKMB, CKMBINDEX, TROPONINI in the last 168 hours.  BNP (last 3 results) No results for input(s): PROBNP in the last 8760 hours. CBG: No results for input(s): GLUCAP in the last 168 hours.  Radiological Exams on Admission: Ct Angio Chest Pe W/cm &/or Wo Cm  10/29/2015  CLINICAL DATA:  81 year old female with increasing shortness of breath and weakness. History of left-sided breast cancer diagnosed in 2010 and lung cancer diagnosed 2012, with recurrent right-sided breast cancer diagnosed again in 2013. EXAM:  CT ANGIOGRAPHY CHEST WITH CONTRAST TECHNIQUE: Multidetector CT imaging of the chest was performed using the standard protocol during bolus administration of intravenous contrast. Multiplanar CT image reconstructions and MIPs were obtained to evaluate the vascular anatomy. CONTRAST:  183m OMNIPAQUE IOHEXOL 350 MG/ML SOLN COMPARISON:  PET-CT 01/11/2015. FINDINGS: Mediastinum/Lymph Nodes: Image 878of series 10 demonstrates a nonocclusive filling defect in a segmental sized branch to the left upper lobe. No other larger more central filling defects are noted. Heart size is normal. There is no significant pericardial fluid, thickening or pericardial calcification. Enlarged subcarinal lymph node measuring 1.4 cm  in short axis. Esophagus is unremarkable in appearance. Slight rightward shift of all cardiomediastinal structures related to chronic right-sided volume loss. No axillary lymphadenopathy. Numerous surgical clips in the left axilla, presumably from prior lymph node dissection. Lungs/Pleura: Severe chronic fibrotic changes again noted throughout the right lung where there is widespread varicose and cystic bronchiectasis. The overall appearance of the right hemithorax is most compatible with chronic fibrothorax, including the presence of a chronic pneumothorax which is notable in the right apex, only slightly larger than remote prior study 01/11/2015. Extensive consolidative changes in the right lower lobe are noted, where there is a new large cavitary area measuring 4.2 x 3.9 cm (image 53 of series 11). Small right pleural effusion. Fibrotic changes in the apex of the left upper lobe and anterior aspect of the left upper lobe, presumably related to prior left-sided breast and axilla radiation therapy. Pleural-based sessile nodule in the medial aspect of the left lower lobe (image 70 of series 4) new compared to the prior study, measuring 2.9 x 1.2 cm. Upper Abdomen: Large 3.9 x 6.8 cm low-attenuation lesion in the  superior aspect of the spleen appears progressively increased in size compared to prior examinations, suspicious for a cystic metastasis. Musculoskeletal/Soft Tissues: Chronic compression fracture of L1 with post vertebroplasty changes. Old healed fractures of the lateral aspect of the right seventh and eighth ribs. There are no aggressive appearing lytic or blastic lesions noted in the visualized portions of the skeleton. Review of the MIP images confirms the above findings. IMPRESSION: 1. Small nonobstructive segmental sized embolus to the left upper lobe. 2. Progressively worsening right-sided fibrothorax with widespread varicose and cystic bronchiectasis throughout the right lung, including a new large 4.2 x 3.9 cm cystic area in the right lower lobe in the midst of an area of consolidation. Clinical correlation for signs and symptoms of acute cavitary pneumonia is recommended. There is also small right-sided parapneumonic pleural effusion. 3. New sessile pleural-based nodule measuring 2.9 x 1.2 cm in the medial left lower lobe, concerning for potential neoplasm (either primary neoplasm or metastatic). 4. Enlarging cystic lesion in the spleen concerning for potential cystic splenic metastasis. 5. Additional incidental findings, as above. Electronically Signed   By: Vinnie Langton M.D.   On: 10/29/2015 18:30    EKG: Independently reviewed.  Assessment/Plan Principal Problem:   Cavitary pneumonia Active Problems:   HTN (hypertension)   Multiple lung nodules   Pulmonary embolism (HCC)   Metastatic breast cancer ([place])   1. Cavitary PNA - failed treatment with levaquin 1. Will treat with zosyn / vanc 2. PNA pathway 3. Alternatively could represent neoplastic process / post-obstructive PNA as she does have cancer throughout that lung it appears. 2. Metastatic BRCA - IP consult to oncology placed 3. HTN - continue home meds 4. PE - heparin gtt for acute PE    Code Status: Full  Family  Communication: Family at bedside Disposition Plan: Admit to inpatient   Time spent: 70 min  GARDNER, JARED M. [facility] Pager [phone]  If 7AM-7PM, please contact the day team taking care of the patient [email or web address] Password [email or web address] 10/29/2015, 7:52 PM

## 2015-10-29 NOTE — ED Notes (Signed)
Bed: GA89 Expected date:  Expected time:  Means of arrival:  Comments: Hold for triage 2

## 2015-10-30 DIAGNOSIS — I2699 Other pulmonary embolism without acute cor pulmonale: Secondary | ICD-10-CM

## 2015-10-30 DIAGNOSIS — I10 Essential (primary) hypertension: Secondary | ICD-10-CM

## 2015-10-30 DIAGNOSIS — J984 Other disorders of lung: Secondary | ICD-10-CM

## 2015-10-30 DIAGNOSIS — C799 Secondary malignant neoplasm of unspecified site: Secondary | ICD-10-CM

## 2015-10-30 LAB — CBC
HEMATOCRIT: 30.9 % — AB (ref 36.0–46.0)
Hemoglobin: 10 g/dL — ABNORMAL LOW (ref 12.0–15.0)
MCH: 30 pg (ref 26.0–34.0)
MCHC: 32.4 g/dL (ref 30.0–36.0)
MCV: 92.8 fL (ref 78.0–100.0)
PLATELETS: 330 10*3/uL (ref 150–400)
RBC: 3.33 MIL/uL — AB (ref 3.87–5.11)
RDW: 12.8 % (ref 11.5–15.5)
WBC: 18.3 10*3/uL — ABNORMAL HIGH (ref 4.0–10.5)

## 2015-10-30 LAB — LEGIONELLA PNEUMOPHILA SEROGP 1 UR AG: L. pneumophila Serogp 1 Ur Ag: NEGATIVE

## 2015-10-30 LAB — STREP PNEUMONIAE URINARY ANTIGEN: Strep Pneumo Urinary Antigen: NEGATIVE

## 2015-10-30 LAB — HEPARIN LEVEL (UNFRACTIONATED)
Heparin Unfractionated: 0.1 IU/mL — ABNORMAL LOW (ref 0.30–0.70)
Heparin Unfractionated: <0.1 IU/mL — ABNORMAL LOW (ref 0.30–0.70)

## 2015-10-30 MED ORDER — LORAZEPAM 0.5 MG PO TABS
0.5000 mg | ORAL_TABLET | Freq: Once | ORAL | Status: AC
  Administered 2015-10-30: 0.5 mg via ORAL
  Filled 2015-10-30: qty 1

## 2015-10-30 MED ORDER — HEPARIN (PORCINE) IN NACL 100-0.45 UNIT/ML-% IJ SOLN: 900.0000 [IU]/h | INTRAMUSCULAR | Status: DC

## 2015-10-30 MED ORDER — HEPARIN (PORCINE) IN NACL 100-0.45 UNIT/ML-% IJ SOLN
1050.0000 [IU]/h | INTRAMUSCULAR | Status: DC
  Filled 2015-10-30: qty 250

## 2015-10-30 NOTE — Progress Notes (Signed)
Patient had multiple pauses of 1.4 to 1.7 seconds on telemetry. On call notified. No new orders given.

## 2015-10-30 NOTE — Progress Notes (Signed)
[facility] PROGRESS NOTE  Veronica Pierce TKZ:[id number] DOB: 1931/07/07 DOA: 10/29/2015 PCP: Arnette Norris, MD  Assessment/Plan: 1. Cavitary pneumonia- patient started on vancomycin and Zosyn for failed treatment with Levaquin. Will continue with antibiotics and closely monitor the patient in the hospital. 2. Pulmonary embolism- CT angiogram chest performed showed small nonobstructive segmental sized embolus in the left frontal lobe. Patient currently on heparin. 3. Metastatic breast cancer- patient is status post mastectomy and followed by adjuvant chemotherapy. Currently on anti-estrogen therapy Femara. As per patient she has completed the therapy and no further chemotherapy as per oncology.   Code Status: DO NOT RESUSCITATE, discussed with patient and her daughters at bedside Family Communication: Discussed with patient's daughters at bedside Disposition Plan: To be decided based on patient's clinical improvement in the hospital   Consultants:  None  Procedures:  None  Antibiotics:  Vancomycin  Zosyn  HPI/Subjective: 81 y.o. female with extensive PMH including BRCA with metastatic disease to her R lung, radiation therapy to R lung. Patient presents to ED with c/o worsening SOB over the past month, has generalized weakness, sweating, cough productive of bloody sputum. Recent outpatient work up was positive for leukocytosis and PNA. She was treated with levaquin as outpatient but this has not improved symptoms other than to cause her hemoptysis to resolved. This morning patient feels better she is not requiring any oxygen. Only gets short of breath on exertion. No chest pain or shortness of breath while sitting.  Objective: Filed Vitals:   10/30/15 0444  BP: 139/57  Pulse: 85  Temp: 98.5 F (36.9 C)  Resp: 24    Intake/Output Summary (Last 24 hours) at 10/30/15 1414 Last data filed at 10/30/15 1041  Gross per 24 hour  Intake  276.3 ml  Output   1850 ml  Net  -1573.7 ml   Filed Weights   10/29/15 2042  Weight: 42.3 kg (93 lb 4.1 oz)    Exam:   General:  Appears in no acute distress  Cardiovascular: S1-S2 regular  Respiratory: Decreased breath sounds at lung bases  Abdomen: Soft, nontender, no organomegaly  Musculoskeletal: No cyanosis/clubbing/edema of the lower extremities   Data Reviewed: Basic Metabolic Panel:  Recent Labs Lab 10/29/15 1715  NA 136  K 4.0  CL 97*  CO2 30  GLUCOSE 99  BUN 24*  CREATININE 0.94  CALCIUM 9.1   Liver Function Tests:  Recent Labs Lab 10/29/15 1715  AST 18  ALT 14  ALKPHOS 86  BILITOT 0.6  PROT 7.9  ALBUMIN 3.1*   CBC:  Recent Labs Lab 10/29/15 1114 10/29/15 1715 10/30/15 1314  WBC 23.6 Repeated and verified X2.* 19.6* 18.3*  NEUTROABS 21.7* 16.9*  --   HGB 11.1* 11.0* 10.0*  HCT 35.3* 34.0* 30.9*  MCV 92.8 92.9 92.8  PLT 409.0* 361 330    CBG: No results for input(s): GLUCAP in the last 168 hours.  Recent Results (from the past 240 hour(s))  Blood culture (routine x 2)     Status: None (Preliminary result)   Collection Time: 10/29/15  7:10 PM  Result Value Ref Range Status   Specimen Description BLOOD RIGHT ANTECUBITAL  Final   Special Requests BOTTLES DRAWN AEROBIC AND ANAEROBIC 5CC  Final   Culture   Final    NO GROWTH < 12 HOURS Performed at [facility]    Report Status PENDING  Incomplete  Culture, sputum-assessment     Status: None   Collection Time: 10/29/15  9:10 PM  Result  Value Ref Range Status   Specimen Description SPUTUM  Final   Special Requests NONE  Final   Sputum evaluation   Final    MICROSCOPIC FINDINGS SUGGEST THAT THIS SPECIMEN IS NOT REPRESENTATIVE OF LOWER RESPIRATORY SECRETIONS. PLEASE RECOLLECT. NOTIFIED S.PICKETT AT 2137 ON 10/29/15    Report Status 10/29/2015 FINAL  Final     Studies: Ct Angio Chest Pe W/cm &/or Wo Cm  10/29/2015  CLINICAL DATA:  81 year old female with increasing shortness of breath and weakness.  History of left-sided breast cancer diagnosed in 2010 and lung cancer diagnosed 2012, with recurrent right-sided breast cancer diagnosed again in 2013. EXAM: CT ANGIOGRAPHY CHEST WITH CONTRAST TECHNIQUE: Multidetector CT imaging of the chest was performed using the standard protocol during bolus administration of intravenous contrast. Multiplanar CT image reconstructions and MIPs were obtained to evaluate the vascular anatomy. CONTRAST:  12m OMNIPAQUE IOHEXOL 350 MG/ML SOLN COMPARISON:  PET-CT 01/11/2015. FINDINGS: Mediastinum/Lymph Nodes: Image 823of series 10 demonstrates a nonocclusive filling defect in a segmental sized branch to the left upper lobe. No other larger more central filling defects are noted. Heart size is normal. There is no significant pericardial fluid, thickening or pericardial calcification. Enlarged subcarinal lymph node measuring 1.4 cm in short axis. Esophagus is unremarkable in appearance. Slight rightward shift of all cardiomediastinal structures related to chronic right-sided volume loss. No axillary lymphadenopathy. Numerous surgical clips in the left axilla, presumably from prior lymph node dissection. Lungs/Pleura: Severe chronic fibrotic changes again noted throughout the right lung where there is widespread varicose and cystic bronchiectasis. The overall appearance of the right hemithorax is most compatible with chronic fibrothorax, including the presence of a chronic pneumothorax which is notable in the right apex, only slightly larger than remote prior study 01/11/2015. Extensive consolidative changes in the right lower lobe are noted, where there is a new large cavitary area measuring 4.2 x 3.9 cm (image 53 of series 11). Small right pleural effusion. Fibrotic changes in the apex of the left upper lobe and anterior aspect of the left upper lobe, presumably related to prior left-sided breast and axilla radiation therapy. Pleural-based sessile nodule in the medial aspect of the  left lower lobe (image 70 of series 4) new compared to the prior study, measuring 2.9 x 1.2 cm. Upper Abdomen: Large 3.9 x 6.8 cm low-attenuation lesion in the superior aspect of the spleen appears progressively increased in size compared to prior examinations, suspicious for a cystic metastasis. Musculoskeletal/Soft Tissues: Chronic compression fracture of L1 with post vertebroplasty changes. Old healed fractures of the lateral aspect of the right seventh and eighth ribs. There are no aggressive appearing lytic or blastic lesions noted in the visualized portions of the skeleton. Review of the MIP images confirms the above findings. IMPRESSION: 1. Small nonobstructive segmental sized embolus to the left upper lobe. 2. Progressively worsening right-sided fibrothorax with widespread varicose and cystic bronchiectasis throughout the right lung, including a new large 4.2 x 3.9 cm cystic area in the right lower lobe in the midst of an area of consolidation. Clinical correlation for signs and symptoms of acute cavitary pneumonia is recommended. There is also small right-sided parapneumonic pleural effusion. 3. New sessile pleural-based nodule measuring 2.9 x 1.2 cm in the medial left lower lobe, concerning for potential neoplasm (either primary neoplasm or metastatic). 4. Enlarging cystic lesion in the spleen concerning for potential cystic splenic metastasis. 5. Additional incidental findings, as above. Electronically Signed   By: DVinnie LangtonM.D.   On: 10/29/2015  18:30    Scheduled Meds: . acetaminophen  650 mg Oral BID  . antiseptic oral rinse  7 mL Mouth Rinse q12n4p  . chlorhexidine  15 mL Mouth Rinse BID  . feeding supplement (ENSURE ENLIVE)  237 mL Oral BID BM  . ipratropium-albuterol  3 mL Nebulization QID  . letrozole  2.5 mg Oral Daily  . levothyroxine  100 mcg Oral QAC breakfast  . pilocarpine  5 mg Oral BID  . piperacillin-tazobactam (ZOSYN)  IV  3.375 g Intravenous Q8H  . vancomycin  500 mg  Intravenous Q24H   Continuous Infusions: . heparin 900 Units/hr (10/30/15 0443)    Principal Problem:   Cavitary pneumonia Active Problems:   HTN (hypertension)   Multiple lung nodules   Pulmonary embolism ([place])   Metastatic breast cancer ([place])    Time spent: 25 min    [place] [facility] Pager [phone]. If 7PM-7AM, please contact night-coverage at [email or web address], password [facility] 10/30/2015, 2:14 PM  LOS: 1 day

## 2015-10-30 NOTE — Progress Notes (Signed)
Utilization Review Completed.Veronica Pierce T11/04/2015  

## 2015-10-30 NOTE — Progress Notes (Addendum)
ANTICOAGULATION CONSULT NOTE - follow-up  Pharmacy Consult for Heparin Indication: pulmonary embolus  No Known Allergies  Patient Measurements: Height: '5\' 5"'$  (165.1 cm) Weight: 93 lb 4.1 oz (42.3 kg) IBW/kg (Calculated) : 57 Heparin Dosing Weight: 41.3  Vital Signs: Temp: 98.5 F (36.9 C) (11/05 0444) Temp Source: Oral (11/05 0444) BP: 139/57 mmHg (11/05 0444) Pulse Rate: 85 (11/05 0444)  Labs:  Recent Labs  10/29/15 1114 10/29/15 1715 10/29/15 1924 10/30/15 0405 10/30/15 1314  HGB 11.1* 11.0*  --   --  10.0*  HCT 35.3* 34.0*  --   --  30.9*  PLT 409.0* 361  --   --  330  APTT  --   --  43*  --   --   LABPROT  --   --  15.8*  --   --   INR  --   --  1.24  --   --   HEPARINUNFRC  --   --  <0.10* 0.10*  --   CREATININE  --  0.94  --   --   --     Estimated Creatinine Clearance: 30.8 mL/min (by C-G formula based on Cr of 0.94).   Medications:  Infusions:  . heparin 900 Units/hr (10/30/15 0443)    Assessment:  46 yoF presenting w/ SOB.  CT reveals nonocclusive filling defect in a segmental sized branch to the left upper lobe as well as signs of cavitary PNA.  Pharmacy to dose heparin   Baseline INR = 1.24, aPTT = 43sec  Prior anticoagulation: none  Significant events:  Today, 10/30/2015:  Heparin level remains undetectable on heparin gtt at 900 units/hr  No issues with pump or infusion, running at expected rate  CBC: Hgb = 10 (trending down); Plt WNL  Patient relates mild hemoptysis in mornings (pinkish sputum)  CrCl: 30 ml/min (baseline) - may be underestimating actual renal function d/t low TBW  Goal of Therapy: Heparin level 0.3-0.7 units/ml Monitor platelets by anticoagulation protocol: Yes  Plan:  Increase heparin gtt to 1050 units/hr  Omit bolus with nonocclusive clot and recent hemoptysis  Check heparin level 8 hrs after increase rate  If level remains ~ undetectable, suggest change to enoxaparin '40mg'$  SQ q12h (Cockgroft-Gault  calculation likely underestimating renal function d/t low body weight)  Daily CBC, daily heparin level once stable  Monitor for signs of bleeding or thrombosis  Doreene Eland, PharmD, BCPS.   Pager: [phone] 10/30/2015 1:49 PM

## 2015-10-30 NOTE — Progress Notes (Signed)
PHARMACY - HEPARIN (brief note)  IV Heparin infusing @ 750 units/hr for Pulmonary Embolism  Heparin level = 0.1 (Goal 9.0-2.1) No complications of therapy noted  Assessment:  Heparin level subtherapeutic  Plan:  Increase Heparin infusion to 900 units/hr           Check heparin level and CBC 8 hr after rate increase  Leone Haven, PharmD

## 2015-10-31 LAB — BASIC METABOLIC PANEL
Anion gap: 8 (ref 5–15)
BUN: 15 mg/dL (ref 6–20)
CALCIUM: 8.6 mg/dL — AB (ref 8.9–10.3)
CO2: 31 mmol/L (ref 22–32)
CREATININE: 0.71 mg/dL (ref 0.44–1.00)
Chloride: 99 mmol/L — ABNORMAL LOW (ref 101–111)
GFR calc non Af Amer: >60 mL/min (ref >60)
Glucose, Bld: 109 mg/dL — ABNORMAL HIGH (ref 65–99)
Potassium: 4.1 mmol/L (ref 3.5–5.1)
Sodium: 138 mmol/L (ref 135–145)

## 2015-10-31 LAB — HEPARIN LEVEL (UNFRACTIONATED)
HEPARIN UNFRACTIONATED: 0.16 [IU]/mL — AB (ref 0.30–0.70)
Heparin Unfractionated: 0.32 IU/mL (ref 0.30–0.70)
Heparin Unfractionated: <0.1 IU/mL — ABNORMAL LOW (ref 0.30–0.70)

## 2015-10-31 LAB — CBC
HEMATOCRIT: 31.5 % — AB (ref 36.0–46.0)
Hemoglobin: 10 g/dL — ABNORMAL LOW (ref 12.0–15.0)
MCH: 29.8 pg (ref 26.0–34.0)
MCHC: 31.7 g/dL (ref 30.0–36.0)
MCV: 93.8 fL (ref 78.0–100.0)
Platelets: 360 10*3/uL (ref 150–400)
RBC: 3.36 MIL/uL — ABNORMAL LOW (ref 3.87–5.11)
RDW: 13.2 % (ref 11.5–15.5)
WBC: 12.9 10*3/uL — ABNORMAL HIGH (ref 4.0–10.5)

## 2015-10-31 MED ORDER — HEPARIN (PORCINE) IN NACL 100-0.45 UNIT/ML-% IJ SOLN
1350.0000 [IU]/h | INTRAMUSCULAR | Status: DC
  Administered 2015-10-31: 1350 [IU]/h via INTRAVENOUS
  Administered 2015-10-31: 1200 [IU]/h via INTRAVENOUS
  Filled 2015-10-31: qty 250

## 2015-10-31 MED ORDER — LORAZEPAM 0.5 MG PO TABS
0.5000 mg | ORAL_TABLET | Freq: Once | ORAL | Status: AC
  Administered 2015-10-31: 0.5 mg via ORAL
  Filled 2015-10-31: qty 1

## 2015-10-31 MED ORDER — GUAIFENESIN ER 600 MG PO TB12
1200.0000 mg | ORAL_TABLET | Freq: Two times a day (BID) | ORAL | Status: DC
  Administered 2015-10-31 – 2015-11-02 (×5): 1200 mg via ORAL
  Filled 2015-10-31 (×5): qty 2

## 2015-10-31 NOTE — Progress Notes (Signed)
[facility] PROGRESS NOTE  Veronica Pierce OIT:[id number] DOB: 1931-05-03 DOA: 10/29/2015 PCP: Arnette Norris, MD  Assessment/Plan: 1. Cavitary pneumonia- patient started on vancomycin and Zosyn for failed treatment with Levaquin. WBC is improving today white count is 12.9 down from 18.3 yesterday. 2. Pulmonary embolism- CT angiogram chest performed showed small nonobstructive segmental sized embolus in the left frontal lobe. Patient currently on heparin. 3. Metastatic breast cancer- patient is status post mastectomy and followed by adjuvant chemotherapy. Currently on anti-estrogen therapy Femara. As per patient she has completed the therapy and no further chemotherapy as per oncology.   Code Status: DO NOT RESUSCITATE, discussed with patient and her daughters at bedside Family Communication: Discussed with patient's daughters at bedside Disposition Plan: To be decided based on patient's clinical improvement in the hospital   Consultants:  None  Procedures:  None  Antibiotics:  Vancomycin  Zosyn  HPI/Subjective: 81 y.o. female with extensive PMH including BRCA with metastatic disease to her R lung, radiation therapy to R lung. Patient presents to ED with c/o worsening SOB over the past month, has generalized weakness, sweating, cough productive of bloody sputum. Recent outpatient work up was positive for leukocytosis and PNA. She was treated with levaquin as outpatient but this has not improved symptoms other than to cause her hemoptysis to resolved. This morning patient feels better.  Objective: Filed Vitals:   10/31/15 1403  BP: 133/53  Pulse: 84  Temp: 98.4 F (36.9 C)  Resp: 18    Intake/Output Summary (Last 24 hours) at 10/31/15 1524 Last data filed at 10/31/15 0736  Gross per 24 hour  Intake    440 ml  Output    300 ml  Net    140 ml   Filed Weights   10/29/15 2042  Weight: 42.3 kg (93 lb 4.1 oz)    Exam:   General:  Appears in no acute  distress  Cardiovascular: S1-S2 regular  Respiratory: Decreased breath sounds at lung bases  Abdomen: Soft, nontender, no organomegaly  Musculoskeletal: No cyanosis/clubbing/edema of the lower extremities   Data Reviewed: Basic Metabolic Panel:  Recent Labs Lab 10/29/15 1715 10/31/15 0529  NA 136 138  K 4.0 4.1  CL 97* 99*  CO2 30 31  GLUCOSE 99 109*  BUN 24* 15  CREATININE 0.94 0.71  CALCIUM 9.1 8.6*   Liver Function Tests:  Recent Labs Lab 10/29/15 1715  AST 18  ALT 14  ALKPHOS 86  BILITOT 0.6  PROT 7.9  ALBUMIN 3.1*   CBC:  Recent Labs Lab 10/29/15 1114 10/29/15 1715 10/30/15 1314 10/31/15 0529  WBC 23.6 Repeated and verified X2.* 19.6* 18.3* 12.9*  NEUTROABS 21.7* 16.9*  --   --   HGB 11.1* 11.0* 10.0* 10.0*  HCT 35.3* 34.0* 30.9* 31.5*  MCV 92.8 92.9 92.8 93.8  PLT 409.0* 361 330 360    CBG: No results for input(s): GLUCAP in the last 168 hours.  Recent Results (from the past 240 hour(s))  Blood culture (routine x 2)     Status: None (Preliminary result)   Collection Time: 10/29/15  7:10 PM  Result Value Ref Range Status   Specimen Description BLOOD RIGHT ANTECUBITAL  Final   Special Requests BOTTLES DRAWN AEROBIC AND ANAEROBIC 5CC  Final   Culture   Final    NO GROWTH 2 DAYS Performed at [facility]    Report Status PENDING  Incomplete  Blood culture (routine x 2)     Status: None (Preliminary result)  Collection Time: 10/29/15  8:00 PM  Result Value Ref Range Status   Specimen Description BLOOD LEFT ARM  Final   Special Requests BOTTLES DRAWN AEROBIC AND ANAEROBIC 10CC EACH  Final   Culture   Final    NO GROWTH 1 DAY Performed at [facility]    Report Status PENDING  Incomplete  Culture, sputum-assessment     Status: None   Collection Time: 10/29/15  9:10 PM  Result Value Ref Range Status   Specimen Description SPUTUM  Final   Special Requests NONE  Final   Sputum evaluation   Final    MICROSCOPIC FINDINGS  SUGGEST THAT THIS SPECIMEN IS NOT REPRESENTATIVE OF LOWER RESPIRATORY SECRETIONS. PLEASE RECOLLECT. NOTIFIED S.PICKETT AT 2137 ON 10/29/15    Report Status 10/29/2015 FINAL  Final     Studies: Ct Angio Chest Pe W/cm &/or Wo Cm  10/29/2015  CLINICAL DATA:  81 year old female with increasing shortness of breath and weakness. History of left-sided breast cancer diagnosed in 2010 and lung cancer diagnosed 2012, with recurrent right-sided breast cancer diagnosed again in 2013. EXAM: CT ANGIOGRAPHY CHEST WITH CONTRAST TECHNIQUE: Multidetector CT imaging of the chest was performed using the standard protocol during bolus administration of intravenous contrast. Multiplanar CT image reconstructions and MIPs were obtained to evaluate the vascular anatomy. CONTRAST:  169m OMNIPAQUE IOHEXOL 350 MG/ML SOLN COMPARISON:  PET-CT 01/11/2015. FINDINGS: Mediastinum/Lymph Nodes: Image 876of series 10 demonstrates a nonocclusive filling defect in a segmental sized branch to the left upper lobe. No other larger more central filling defects are noted. Heart size is normal. There is no significant pericardial fluid, thickening or pericardial calcification. Enlarged subcarinal lymph node measuring 1.4 cm in short axis. Esophagus is unremarkable in appearance. Slight rightward shift of all cardiomediastinal structures related to chronic right-sided volume loss. No axillary lymphadenopathy. Numerous surgical clips in the left axilla, presumably from prior lymph node dissection. Lungs/Pleura: Severe chronic fibrotic changes again noted throughout the right lung where there is widespread varicose and cystic bronchiectasis. The overall appearance of the right hemithorax is most compatible with chronic fibrothorax, including the presence of a chronic pneumothorax which is notable in the right apex, only slightly larger than remote prior study 01/11/2015. Extensive consolidative changes in the right lower lobe are noted, where there is a  new large cavitary area measuring 4.2 x 3.9 cm (image 53 of series 11). Small right pleural effusion. Fibrotic changes in the apex of the left upper lobe and anterior aspect of the left upper lobe, presumably related to prior left-sided breast and axilla radiation therapy. Pleural-based sessile nodule in the medial aspect of the left lower lobe (image 70 of series 4) new compared to the prior study, measuring 2.9 x 1.2 cm. Upper Abdomen: Large 3.9 x 6.8 cm low-attenuation lesion in the superior aspect of the spleen appears progressively increased in size compared to prior examinations, suspicious for a cystic metastasis. Musculoskeletal/Soft Tissues: Chronic compression fracture of L1 with post vertebroplasty changes. Old healed fractures of the lateral aspect of the right seventh and eighth ribs. There are no aggressive appearing lytic or blastic lesions noted in the visualized portions of the skeleton. Review of the MIP images confirms the above findings. IMPRESSION: 1. Small nonobstructive segmental sized embolus to the left upper lobe. 2. Progressively worsening right-sided fibrothorax with widespread varicose and cystic bronchiectasis throughout the right lung, including a new large 4.2 x 3.9 cm cystic area in the right lower lobe in the midst of an area  of consolidation. Clinical correlation for signs and symptoms of acute cavitary pneumonia is recommended. There is also small right-sided parapneumonic pleural effusion. 3. New sessile pleural-based nodule measuring 2.9 x 1.2 cm in the medial left lower lobe, concerning for potential neoplasm (either primary neoplasm or metastatic). 4. Enlarging cystic lesion in the spleen concerning for potential cystic splenic metastasis. 5. Additional incidental findings, as above. Electronically Signed   By: Vinnie Langton M.D.   On: 10/29/2015 18:30    Scheduled Meds: . acetaminophen  650 mg Oral BID  . antiseptic oral rinse  7 mL Mouth Rinse q12n4p  .  chlorhexidine  15 mL Mouth Rinse BID  . feeding supplement (ENSURE ENLIVE)  237 mL Oral BID BM  . guaiFENesin  1,200 mg Oral BID  . ipratropium-albuterol  3 mL Nebulization QID  . letrozole  2.5 mg Oral Daily  . levothyroxine  100 mcg Oral QAC breakfast  . pilocarpine  5 mg Oral BID  . piperacillin-tazobactam (ZOSYN)  IV  3.375 g Intravenous Q8H  . vancomycin  500 mg Intravenous Q24H   Continuous Infusions: . heparin 1,200 Units/hr (10/31/15 0055)    Principal Problem:   Cavitary pneumonia Active Problems:   HTN (hypertension)   Multiple lung nodules   Pulmonary embolism ([place])   Metastatic breast cancer ([place])    Time spent: 25 min    [place] [facility] Pager [phone]. If 7PM-7AM, please contact night-coverage at [email or web address], password [facility] 10/31/2015, 3:24 PM  LOS: 2 days

## 2015-10-31 NOTE — Progress Notes (Signed)
ANTICOAGULATION CONSULT NOTE - follow-up  Pharmacy Consult for Heparin Indication: pulmonary embolus  No Known Allergies  Patient Measurements: Height: '5\' 5"'$  (165.1 cm) Weight: 93 lb 4.1 oz (42.3 kg) IBW/kg (Calculated) : 57 Heparin Dosing Weight: 41.3  Vital Signs: Temp: 98.9 F (37.2 C) (11/06 0531) Temp Source: Oral (11/06 0531) BP: 148/59 mmHg (11/06 0531) Pulse Rate: 81 (11/06 0531)  Labs:  Recent Labs  10/29/15 1715 10/29/15 1924  10/30/15 1314 10/30/15 2333 10/31/15 0529 10/31/15 0907  HGB 11.0*  --   --  10.0*  --  10.0*  --   HCT 34.0*  --   --  30.9*  --  31.5*  --   PLT 361  --   --  330  --  360  --   APTT  --  43*  --   --   --   --   --   LABPROT  --  15.8*  --   --   --   --   --   INR  --  1.24  --   --   --   --   --   HEPARINUNFRC  --  <0.10*  < > <0.10* 0.16*  --  0.32  CREATININE 0.94  --   --   --   --  0.71  --   < > = values in this interval not displayed.  Estimated Creatinine Clearance: 36.2 mL/min (by C-G formula based on Cr of 0.71).   Medications:  Infusions:  . heparin 1,200 Units/hr (10/31/15 0055)    Assessment:  41 yoF presenting w/ SOB.  CT reveals nonocclusive filling defect in a segmental sized branch to the left upper lobe as well as signs of cavitary PNA.  Pharmacy to dose heparin   Baseline INR = 1.24, aPTT = 43sec  Prior anticoagulation: none  Significant events:  Today, 10/31/2015:  Heparin level therapeutic this mornining on heparin at 1200 Units/hr  CBC: Hgb = 10 -stable; Plt WNL  Patient relates mild hemoptysis in mornings (pinkish sputum)  CrCl: 36 ml/min - Cockgroft-Gault calculation may be underestimating actual renal function d/t low TBW  Goal of Therapy: Heparin level 0.3-0.7 units/ml Monitor platelets by anticoagulation protocol: Yes  Plan:  Continue heparin gtt at 1200 units/hr  Check heparin level 8 hrs to confirm therapeutic  Daily CBC, daily heparin   Monitor for signs of bleeding or  thrombosis  Due to malignancy, for discharge anticoagulation, may consider enoxaparin '40mg'$  SQ q12h   Doreene Eland, PharmD, BCPS.   Pager: [phone] 10/31/2015 10:43 AM

## 2015-10-31 NOTE — Progress Notes (Signed)
Initial Nutrition Assessment  DOCUMENTATION CODES:   Severe malnutrition in context of chronic illness, Underweight  INTERVENTION:   -Continue Ensure Enlive po BID, each supplement provides 350 kcal and 20 grams of protein -Encourage PO intake -RD to continue to monitor  NUTRITION DIAGNOSIS:   Malnutrition related to chronic illness as evidenced by severe depletion of body fat, severe depletion of muscle mass.  GOAL:   Patient will meet greater than or equal to 90% of their needs  MONITOR:   PO intake, Supplement acceptance, Labs, Weight trends, Skin, I & O's  REASON FOR ASSESSMENT:   Malnutrition Screening Tool    ASSESSMENT:   81 y.o. female with extensive PMH including BRCA with metastatic disease to her R lung, radiation therapy to R lung. Patient presents to ED with c/o worsening SOB over the past month, has generalized weakness, sweating, cough productive of bloody sputum  Pt reports lower appetite the past couple of months with resulting weight loss. Pt used to weigh ~104 lb but is now down to 93 lb (7% weight loss x 6 months, insignificant for time frame). Pt has been eating 10-100% of regular diet. Pt usually drinks 2 Ensure supplements a day at home. Pt has been ordered Ensure Enlive supplements here and is drinking them. She is also ordering ice cream on all her trays.   Nutrition-Focused physical exam completed. Findings are severe fat depletion, severe muscle depletion, and no edema.   Labs reviewed.  Diet Order:  Diet regular Room service appropriate?: Yes; Fluid consistency:: Thin  Skin:  Reviewed, no issues  Last BM:  11/4  Height:   Ht Readings from Last 1 Encounters:  10/29/15 5' 5"  (1.651 m)    Weight:   Wt Readings from Last 1 Encounters:  10/29/15 93 lb 4.1 oz (42.3 kg)    Ideal Body Weight:  56.8 kg  BMI:  Body mass index is 15.52 kg/(m^2).  Estimated Nutritional Needs:   Kcal:  1300-1500  Protein:  65-75g  Fluid:   1.5L/day  EDUCATION NEEDS:   No education needs identified at this time  Clayton Bibles, MS, RD, LDN Pager: [phone] After Hours Pager: [phone]

## 2015-10-31 NOTE — Progress Notes (Signed)
ANTICOAGULATION CONSULT NOTE - follow-up  Pharmacy Consult for Heparin Indication: pulmonary embolus  No Known Allergies  Patient Measurements: Height: '5\' 5"'$  (165.1 cm) Weight: 93 lb 4.1 oz (42.3 kg) IBW/kg (Calculated) : 57 Heparin Dosing Weight: actual body weight  Vital Signs: Temp: 98.7 F (37.1 C) (11/05 1951) Temp Source: Oral (11/05 1951) BP: 142/50 mmHg (11/05 1951) Pulse Rate: 93 (11/05 1951)  Labs:  Recent Labs  10/29/15 1114 10/29/15 1715  10/29/15 1924 10/30/15 0405 10/30/15 1314 10/30/15 2333  HGB 11.1* 11.0*  --   --   --  10.0*  --   HCT 35.3* 34.0*  --   --   --  30.9*  --   PLT 409.0* 361  --   --   --  330  --   APTT  --   --   --  43*  --   --   --   LABPROT  --   --   --  15.8*  --   --   --   INR  --   --   --  1.24  --   --   --   HEPARINUNFRC  --   --   < > <0.10* 0.10* <0.10* 0.16*  CREATININE  --  0.94  --   --   --   --   --   < > = values in this interval not displayed.  Estimated Creatinine Clearance: 30.8 mL/min (by C-G formula based on Cr of 0.94).   Medications:  Infusions:  . heparin 1,050 Units/hr (10/30/15 1510)    Assessment:  84 yoF presenting w/ SOB.  CT reveals nonocclusive filling defect in a segmental sized branch to the left upper lobe as well as signs of cavitary PNA.  Pharmacy to dose heparin   Baseline INR = 1.24, aPTT = 43sec  Prior anticoagulation: none  Significant events:  Today, 10/31/2015:  Heparin level = 0.16 with heparin gtt at 1050 units/hr  Noted patient relates mild hemoptysis in mornings (pinkish sputum)  CrCl: 30 ml/min (baseline) - may be underestimating actual renal function d/t low TBW  Goal of Therapy: Heparin level 0.3-0.7 units/ml Monitor platelets by anticoagulation protocol: Yes  Plan:  Increase heparin gtt to 1200 units/hr  Omit bolus with nonocclusive clot and recent hemoptysis  Check heparin level 8 hrs after increase rate  Daily CBC, daily heparin level once  stable  Monitor for signs of bleeding or thrombosis  Leone Haven, PharmD  10/31/2015 12:13 AM

## 2015-10-31 NOTE — Progress Notes (Signed)
Pharmacy: Re- Heparin  Patient's an 81 y.o F on heparin for new PE.  Repeat heparin level now back undetectable (<0.10).  RN reported no issues with IV line or bleeding noted.  Plan: - Will increase heparin drip to 1350 units/hr  - check 8 hour heparin level  Dia Sitter, PharmD, BCPS 10/31/2015 6:12 PM

## 2015-11-01 DIAGNOSIS — D7389 Other diseases of spleen: Secondary | ICD-10-CM

## 2015-11-01 DIAGNOSIS — C50919 Malignant neoplasm of unspecified site of unspecified female breast: Secondary | ICD-10-CM

## 2015-11-01 DIAGNOSIS — J189 Pneumonia, unspecified organism: Secondary | ICD-10-CM

## 2015-11-01 DIAGNOSIS — R911 Solitary pulmonary nodule: Secondary | ICD-10-CM

## 2015-11-01 DIAGNOSIS — C7800 Secondary malignant neoplasm of unspecified lung: Secondary | ICD-10-CM

## 2015-11-01 LAB — BASIC METABOLIC PANEL
ANION GAP: 6 (ref 5–15)
BUN: 16 mg/dL (ref 6–20)
CALCIUM: 8.6 mg/dL — AB (ref 8.9–10.3)
CO2: 30 mmol/L (ref 22–32)
CREATININE: 0.74 mg/dL (ref 0.44–1.00)
Chloride: 100 mmol/L — ABNORMAL LOW (ref 101–111)
GLUCOSE: 118 mg/dL — AB (ref 65–99)
Potassium: 4.2 mmol/L (ref 3.5–5.1)
Sodium: 136 mmol/L (ref 135–145)

## 2015-11-01 LAB — CBC
HEMATOCRIT: 30.8 % — AB (ref 36.0–46.0)
Hemoglobin: 9.8 g/dL — ABNORMAL LOW (ref 12.0–15.0)
MCH: 29.7 pg (ref 26.0–34.0)
MCHC: 31.8 g/dL (ref 30.0–36.0)
MCV: 93.3 fL (ref 78.0–100.0)
PLATELETS: 333 10*3/uL (ref 150–400)
RBC: 3.3 MIL/uL — ABNORMAL LOW (ref 3.87–5.11)
RDW: 13.2 % (ref 11.5–15.5)
WBC: 14.7 10*3/uL — AB (ref 4.0–10.5)

## 2015-11-01 LAB — HEPARIN LEVEL (UNFRACTIONATED): Heparin Unfractionated: 0.48 IU/mL (ref 0.30–0.70)

## 2015-11-01 LAB — MAGNESIUM: MAGNESIUM: 1.9 mg/dL (ref 1.7–2.4)

## 2015-11-01 MED ORDER — LORAZEPAM 0.5 MG PO TABS
0.5000 mg | ORAL_TABLET | Freq: Once | ORAL | Status: AC
  Administered 2015-11-01: 0.5 mg via ORAL
  Filled 2015-11-01: qty 1

## 2015-11-01 MED ORDER — IPRATROPIUM-ALBUTEROL 0.5-2.5 (3) MG/3ML IN SOLN
3.0000 mL | Freq: Three times a day (TID) | RESPIRATORY_TRACT | Status: DC
  Administered 2015-11-01 – 2015-11-02 (×2): 3 mL via RESPIRATORY_TRACT
  Filled 2015-11-01 (×3): qty 3

## 2015-11-01 MED ORDER — ENOXAPARIN SODIUM 40 MG/0.4ML ~~LOC~~ SOLN
40.0000 mg | Freq: Two times a day (BID) | SUBCUTANEOUS | Status: DC
  Administered 2015-11-01 – 2015-11-02 (×3): 40 mg via SUBCUTANEOUS
  Filled 2015-11-01 (×3): qty 0.4

## 2015-11-01 NOTE — Progress Notes (Signed)
ANTICOAGULATION CONSULT NOTE - follow-up  Pharmacy Consult for Heparin Indication: pulmonary embolus  No Known Allergies  Patient Measurements: Height: '5\' 5"'$  (165.1 cm) Weight: 93 lb 4.1 oz (42.3 kg) IBW/kg (Calculated) : 57 Heparin Dosing Weight: 41.3  Vital Signs: Temp: 99.4 F (37.4 C) (11/07 0100) Temp Source: Oral (11/07 0100) BP: 141/61 mmHg (11/06 2015) Pulse Rate: 106 (11/06 2026)  Labs:  Recent Labs  10/29/15 1715 10/29/15 1924  10/30/15 1314  10/31/15 0529 10/31/15 0907 10/31/15 1700 11/01/15 0202 11/01/15 0203  HGB 11.0*  --   --  10.0*  --  10.0*  --   --   --  9.8*  HCT 34.0*  --   --  30.9*  --  31.5*  --   --   --  30.8*  PLT 361  --   --  330  --  360  --   --   --  333  APTT  --  43*  --   --   --   --   --   --   --   --   LABPROT  --  15.8*  --   --   --   --   --   --   --   --   INR  --  1.24  --   --   --   --   --   --   --   --   HEPARINUNFRC  --  <0.10*  < > <0.10*  < >  --  0.32 <0.10* 0.48  --   CREATININE 0.94  --   --   --   --  0.71  --   --   --  0.74  < > = values in this interval not displayed.  Estimated Creatinine Clearance: 36.2 mL/min (by C-G formula based on Cr of 0.74).   Medications:  Infusions:  . heparin 1,350 Units/hr (10/31/15 2030)    Assessment:  84 yoF presenting w/ SOB.  CT reveals nonocclusive filling defect in a segmental sized branch to the left upper lobe as well as signs of cavitary PNA.  Pharmacy to dose heparin   Baseline INR = 1.24, aPTT = 43sec  Prior anticoagulation: none  Significant events:  Today, 11/01/2015:  Heparin level = 0.48 on heparin at 1350 Units/hr  CBC: Hgb = 9.8 -stable; Plt WNL  Patient relates mild hemoptysis in mornings (pinkish sputum)  CrCl: 36 ml/min - [place]groft-Gault calculation may be underestimating actual renal function d/t low TBW  Goal of Therapy: Heparin level 0.3-0.7 units/ml Monitor platelets by anticoagulation protocol: Yes  Plan:  Continue heparin gtt at  1350 units/hr  Check heparin level 8 hrs to confirm therapeutic  Daily CBC, daily heparin   Monitor for signs of bleeding or thrombosis  Due to malignancy, for discharge anticoagulation, may consider enoxaparin '40mg'$  SQ q12h   Leone Haven, PharmD  11/01/2015 3:10 AM

## 2015-11-01 NOTE — Progress Notes (Signed)
[facility] PROGRESS NOTE  Ellicia Alix FBX:[id number] DOB: Jan 26, 1931 DOA: 10/29/2015 PCP: Arnette Norris, MD  Assessment/Plan: 1. Cavitary pneumonia- patient started on vancomycin and Zosyn for failed treatment with Levaquin. WBC is improving today white count is 14.7 down from 18.3 on admission. Urine for Legionella Legionella antigen and strep pneumo antigen is negative. Patient currently not requiring oxygen, she is slowly improving. 2. Pulmonary embolism- CT angiogram chest performed showed small nonobstructive segmental sized embolus in the left frontal lobe. Patient currently on heparin. Will change heparin to Lovenox 40 mg every 12 hours as patient will be discharged on Lovenox. 3. Metastatic breast cancer- patient is status post mastectomy and followed by adjuvant chemotherapy. Currently on anti-estrogen therapy Femara. As per patient she has completed the therapy and no further chemotherapy as per oncology. Called and discussed with patient's oncologist Dr. Sonny Dandy who will follow the patient in the hospital.   Code Status: DO NOT RESUSCITATE, discussed with patient and her daughters at bedside Family Communication: Discussed with patient's daughters at bedside Disposition Plan: To be decided based on patient's clinical improvement in the hospital   Consultants:  None  Procedures:  None  Antibiotics:  Vancomycin  Zosyn  HPI/Subjective: 81 y.o. female with extensive PMH including BRCA with metastatic disease to her R lung, radiation therapy to R lung. Patient presents to ED with c/o worsening SOB over the past month, has generalized weakness, sweating, cough productive of bloody sputum. Recent outpatient work up was positive for leukocytosis and PNA. She was treated with levaquin as outpatient but this has not improved symptoms other than to cause her hemoptysis to resolved. This morning patient is breathing better. She has been afebrile. WBC improved to  14,000  Objective: Filed Vitals:   11/01/15 0603  BP: 149/51  Pulse: 84  Temp: 99.3 F (37.4 C)  Resp: 18    Intake/Output Summary (Last 24 hours) at 11/01/15 0935 Last data filed at 11/01/15 0600  Gross per 24 hour  Intake    800 ml  Output   1200 ml  Net   -400 ml   Filed Weights   10/29/15 2042  Weight: 42.3 kg (93 lb 4.1 oz)    Exam:   General:  Appears in no acute distress  Cardiovascular: S1-S2 regular  Respiratory: Decreased breath sounds at lung bases  Abdomen: Soft, nontender, no organomegaly  Musculoskeletal: No cyanosis/clubbing/edema of the lower extremities   Data Reviewed: Basic Metabolic Panel:  Recent Labs Lab 10/29/15 1715 10/31/15 0529 11/01/15 0203  NA 136 138 136  K 4.0 4.1 4.2  CL 97* 99* 100*  CO2 30 31 30   GLUCOSE 99 109* 118*  BUN 24* 15 16  CREATININE 0.94 0.71 0.74  CALCIUM 9.1 8.6* 8.6*   Liver Function Tests:  Recent Labs Lab 10/29/15 1715  AST 18  ALT 14  ALKPHOS 86  BILITOT 0.6  PROT 7.9  ALBUMIN 3.1*   CBC:  Recent Labs Lab 10/29/15 1114 10/29/15 1715 10/30/15 1314 10/31/15 0529 11/01/15 0203  WBC 23.6 Repeated and verified X2.* 19.6* 18.3* 12.9* 14.7*  NEUTROABS 21.7* 16.9*  --   --   --   HGB 11.1* 11.0* 10.0* 10.0* 9.8*  HCT 35.3* 34.0* 30.9* 31.5* 30.8*  MCV 92.8 92.9 92.8 93.8 93.3  PLT 409.0* 361 330 360 333    CBG: No results for input(s): GLUCAP in the last 168 hours.  Recent Results (from the past 240 hour(s))  Blood culture (routine x 2)  Status: None (Preliminary result)   Collection Time: 10/29/15  7:10 PM  Result Value Ref Range Status   Specimen Description BLOOD RIGHT ANTECUBITAL  Final   Special Requests BOTTLES DRAWN AEROBIC AND ANAEROBIC 5CC  Final   Culture   Final    NO GROWTH 2 DAYS Performed at [facility]    Report Status PENDING  Incomplete  Blood culture (routine x 2)     Status: None (Preliminary result)   Collection Time: 10/29/15  8:00 PM  Result  Value Ref Range Status   Specimen Description BLOOD LEFT ARM  Final   Special Requests BOTTLES DRAWN AEROBIC AND ANAEROBIC 10CC EACH  Final   Culture   Final    NO GROWTH 1 DAY Performed at [facility]    Report Status PENDING  Incomplete  Culture, sputum-assessment     Status: None   Collection Time: 10/29/15  9:10 PM  Result Value Ref Range Status   Specimen Description SPUTUM  Final   Special Requests NONE  Final   Sputum evaluation   Final    MICROSCOPIC FINDINGS SUGGEST THAT THIS SPECIMEN IS NOT REPRESENTATIVE OF LOWER RESPIRATORY SECRETIONS. PLEASE RECOLLECT. NOTIFIED S.PICKETT AT 2137 ON 10/29/15    Report Status 10/29/2015 FINAL  Final     Studies: No results found.  Scheduled Meds: . acetaminophen  650 mg Oral BID  . antiseptic oral rinse  7 mL Mouth Rinse q12n4p  . chlorhexidine  15 mL Mouth Rinse BID  . enoxaparin (LOVENOX) injection  40 mg Subcutaneous Q12H  . feeding supplement (ENSURE ENLIVE)  237 mL Oral BID BM  . guaiFENesin  1,200 mg Oral BID  . ipratropium-albuterol  3 mL Nebulization QID  . letrozole  2.5 mg Oral Daily  . levothyroxine  100 mcg Oral QAC breakfast  . pilocarpine  5 mg Oral BID  . piperacillin-tazobactam (ZOSYN)  IV  3.375 g Intravenous Q8H  . vancomycin  500 mg Intravenous Q24H   Continuous Infusions:    Principal Problem:   Cavitary pneumonia Active Problems:   HTN (hypertension)   Multiple lung nodules   Pulmonary embolism ([place])   Metastatic breast cancer ([place])    Time spent: 25 min    [facility] Pager [phone]. If 7PM-7AM, please contact night-coverage at [email or web address], password [facility] 11/01/2015, 9:35 AM  LOS: 3 days

## 2015-11-01 NOTE — Progress Notes (Signed)
Pt declined HHPT at present time.

## 2015-11-01 NOTE — Progress Notes (Signed)
CCMD called and make me aware that patient is having 3 second bursts of SVT in the 150's.  Then back to NSR in the 90's.  Asymptomatic.  MD made aware, will continue to monitor closely.

## 2015-11-01 NOTE — Progress Notes (Signed)
HEMATOLOGY-ONCOLOGY PROGRESS NOTE  SUBJECTIVE:Appears frail but breathing better today than yday  OBJECTIVE: PHYSICAL EXAMINATION: ECOG PERFORMANCE STATUS: 3 - Symptomatic, >50% confined to bed  Filed Vitals:   11/01/15 1358  BP: 113/60  Pulse: 87  Temp: 98.6 F (37 C)  Resp: 18   Filed Weights   10/29/15 2042  Weight: 93 lb 4.1 oz (42.3 kg)    GENERAL:alert, no distress and comfortable LUNGS: Dim BS HEART: regular rate & rhythm and no murmurs and no lower extremity edema ABDOMEN:abdomen soft Musculoskeletal:no cyanosis of digits NEURO: alert & oriented x 3 with fluent speech, no focal motor/sensory deficits  LABORATORY DATA:  I have reviewed the data as listed CMP Latest Ref Rng 11/01/2015 10/31/2015 10/29/2015  Glucose 65 - 99 mg/dL 118(H) 109(H) 99  BUN 6 - 20 mg/dL 16 15 24(H)  Creatinine 0.44 - 1.00 mg/dL 0.74 0.71 0.94  Sodium 135 - 145 mmol/L 136 138 136  Potassium 3.5 - 5.1 mmol/L 4.2 4.1 4.0  Chloride 101 - 111 mmol/L 100(L) 99(L) 97(L)  CO2 22 - 32 mmol/L '30 31 30  '$ Calcium 8.9 - 10.3 mg/dL 8.6(L) 8.6(L) 9.1  Total Protein 6.5 - 8.1 g/dL - - 7.9  Total Bilirubin 0.3 - 1.2 mg/dL - - 0.6  Alkaline Phos 38 - 126 U/L - - 86  AST 15 - 41 U/L - - 18  ALT 14 - 54 U/L - - 14    Lab Results  Component Value Date   WBC 14.7* 11/01/2015   HGB 9.8* 11/01/2015   HCT 30.8* 11/01/2015   MCV 93.3 11/01/2015   PLT 333 11/01/2015   NEUTROABS 16.9* 10/29/2015    ASSESSMENT AND PLAN: 1. Metastatic breast cancer with lung metastases: I discussed with her the results of the CT chest which revealed bronchiectasis and a new 4.2 cm cystic area in the right lower lobe with consolidation suggestive of cavitary pneumonia as well as a new lung nodule 0.9 cm, cystic lesion in the spleen concerning for splenic metastases.  2. Given her advanced age, I do not recommend any additional systemic treatments other than current anti-estrogen therapy with anastrozole. If she wishes to stop  that, it would not be unreasonable.  3. I discussed with her about hospice care and she was keen to receive their services.  4. DNRCC 5. Goals of treatment are comfort care 6. Pneumonia being treated with Zosyn and vancomycin antibiotics  thank you very much for your excellent care. Please do not stay to call me if there are any questions or concerns

## 2015-11-01 NOTE — Evaluation (Signed)
Physical Therapy Evaluation Patient Details Name: Veronica Pierce MRN: [id number] DOB: Jan 10, 1931 Today's Date: 11/01/2015   History of Present Illness  81 y.o. female with extensive PMH including BRCA with metastatic disease to her R lung, radiation therapy to R lung and admitted with cavitary pneumonia and PE.  Clinical Impression  Pt admitted with above diagnosis. Pt currently with functional limitations due to the deficits listed below (see PT Problem List).   Pt will benefit from skilled PT to increase their independence and safety with mobility to allow discharge to the venue listed below.   Pt reports spouse assists with IADLs since she has not been feeling well and she always has family to assist if needed.  Pt uncertain of HHPT and wishes to discuss this with spouse.       Follow Up Recommendations Home health PT;Supervision for mobility/OOB    Equipment Recommendations  None recommended by PT    Recommendations for Other Services       Precautions / Restrictions Precautions Precautions: Fall      Mobility  Bed Mobility Overal bed mobility: Needs Assistance Bed Mobility: Supine to Sit;Sit to Supine     Supine to sit: Supervision Sit to supine: Supervision      Transfers Overall transfer level: Needs assistance Equipment used: None Transfers: Sit to/from Stand Sit to Stand: Min guard         General transfer comment: min/guard for safety  Ambulation/Gait Ambulation/Gait assistance: Min guard Ambulation Distance (Feet): 80 Feet Assistive device: None Gait Pattern/deviations: Step-through pattern;Decreased stride length     General Gait Details: slow but steady pace, pt pushed IV pole, SPO2 96% room air during ambulation, pt reports a little chest tightness with ambulation  Stairs            Wheelchair Mobility    Modified Rankin (Stroke Patients Only)       Balance Overall balance assessment:  (pt denies hx of falls)                                            Pertinent Vitals/Pain Pain Assessment: No/denies pain    Home Living Family/patient expects to be discharged to:: Private residence Living Arrangements: Spouse/significant other Available Help at Discharge: Family;Available 24 hours/day Type of Home: House       Home Layout: One level Home Equipment: None      Prior Function Level of Independence: Independent               Hand Dominance        Extremity/Trunk Assessment   Upper Extremity Assessment: Generalized weakness           Lower Extremity Assessment: Generalized weakness         Communication   Communication: No difficulties  Cognition Arousal/Alertness: Awake/alert Behavior During Therapy: WFL for tasks assessed/performed Overall Cognitive Status: No family/caregiver present to determine baseline cognitive functioning                 General Comments: appropriate during session, unable to recall reason for admission    General Comments      Exercises        Assessment/Plan    PT Assessment Patient needs continued PT services  PT Diagnosis Difficulty walking   PT Problem List Decreased strength;Decreased activity tolerance;Decreased mobility;Decreased knowledge of use of DME  PT Treatment Interventions DME instruction;Gait  training;Functional mobility training;Patient/family education;Therapeutic activities;Therapeutic exercise   PT Goals (Current goals can be found in the Care Plan section) Acute Rehab PT Goals PT Goal Formulation: With patient Time For Goal Achievement: 11/15/15 Potential to Achieve Goals: Good    Frequency Min 3X/week   Barriers to discharge        Co-evaluation               End of Session   Activity Tolerance: Patient tolerated treatment well Patient left: in bed;with call bell/phone within reach;with bed alarm set           Time: 1290-4753 PT Time Calculation (min) (ACUTE ONLY): 13  min   Charges:   PT Evaluation $Initial PT Evaluation Tier I: 1 Procedure     PT G Codes:        Tavius Turgeon,KATHrine E 11/01/2015, 1:58 PM Carmelia Bake, PT, DPT 11/01/2015 Pager: [phone]

## 2015-11-01 NOTE — Care Management Important Message (Signed)
Important Message  Patient Details  Name: Veronica Pierce MRN: [id number] Date of Birth: 01/26/1931   Medicare Important Message Given:  Yes-second notification given    Camillo Flaming 11/01/2015, 10:41 AMImportant Message  Patient Details  Name: Veronica Pierce MRN: [id number] Date of Birth: 1931-03-29   Medicare Important Message Given:  Yes-second notification given    Camillo Flaming 11/01/2015, 10:41 AM

## 2015-11-02 DIAGNOSIS — R918 Other nonspecific abnormal finding of lung field: Secondary | ICD-10-CM

## 2015-11-02 LAB — GLUCOSE, CAPILLARY: Glucose-Capillary: 110 mg/dL — ABNORMAL HIGH (ref 65–99)

## 2015-11-02 MED ORDER — ENOXAPARIN (LOVENOX) PATIENT EDUCATION KIT
PACK | Freq: Once | Status: DC
  Filled 2015-11-02: qty 1

## 2015-11-02 MED ORDER — GUAIFENESIN ER 600 MG PO TB12: 1200.0000 mg | ORAL_TABLET | Freq: Two times a day (BID) | ORAL | Status: DC

## 2015-11-02 MED ORDER — ENOXAPARIN SODIUM 60 MG/0.6ML ~~LOC~~ SOLN: 60.0000 mg | SUBCUTANEOUS | Status: DC

## 2015-11-02 MED ORDER — IPRATROPIUM-ALBUTEROL 0.5-2.5 (3) MG/3ML IN SOLN: 3.0000 mL | Freq: Three times a day (TID) | RESPIRATORY_TRACT | Status: DC

## 2015-11-02 MED ORDER — RIVAROXABAN (XARELTO) VTE STARTER PACK (15 & 20 MG): ORAL_TABLET | ORAL | Status: DC

## 2015-11-02 MED ORDER — AMOXICILLIN-POT CLAVULANATE 875-125 MG PO TABS: 1.0000 | ORAL_TABLET | Freq: Two times a day (BID) | ORAL | Status: DC

## 2015-11-02 MED ORDER — ENSURE ENLIVE PO LIQD: 237.0000 mL | Freq: Two times a day (BID) | ORAL | Status: AC

## 2015-11-02 NOTE — Progress Notes (Addendum)
ANTIBIOTIC CONSULT NOTE - FOLLOW-UP  Pharmacy Consult for Vancomycin, Zosyn  Indication: pneumonia  No Known Allergies  Patient Measurements: Height: '5\' 5"'$  (165.1 cm) Weight: 93 lb 4.1 oz (42.3 kg) IBW/kg (Calculated) : 57   Vital Signs: Temp: 98.4 F (36.9 C) (11/08 0537) Temp Source: Oral (11/08 0537) BP: 154/56 mmHg (11/08 0537) Pulse Rate: 93 (11/08 0537) Intake/Output from previous day: 11/07 0701 - 11/08 0700 In: 610 [P.O.:360; IV Piggyback:250] Out: 350 [Urine:350] Intake/Output from this shift: Total I/O In: 60 [P.O.:60] Out: 300 [Urine:300]  Labs:  Recent Labs  10/30/15 1314 10/31/15 0529 11/01/15 0203  WBC 18.3* 12.9* 14.7*  HGB 10.0* 10.0* 9.8*  PLT 330 360 333  CREATININE  --  0.71 0.74   Estimated Creatinine Clearance: 36.2 mL/min (by C-G formula based on Cr of 0.74). No results for input(s): VANCOTROUGH, VANCOPEAK, VANCORANDOM, GENTTROUGH, GENTPEAK, GENTRANDOM, TOBRATROUGH, TOBRAPEAK, TOBRARND, AMIKACINPEAK, AMIKACINTROU, AMIKACIN in the last 72 hours.   Microbiology: Recent Results (from the past 720 hour(s))  Blood culture (routine x 2)     Status: None (Preliminary result)   Collection Time: 10/29/15  7:10 PM  Result Value Ref Range Status   Specimen Description BLOOD RIGHT ANTECUBITAL  Final   Special Requests BOTTLES DRAWN AEROBIC AND ANAEROBIC 5CC  Final   Culture   Final    NO GROWTH 3 DAYS Performed at [facility]    Report Status PENDING  Incomplete  Blood culture (routine x 2)     Status: None (Preliminary result)   Collection Time: 10/29/15  8:00 PM  Result Value Ref Range Status   Specimen Description BLOOD LEFT ARM  Final   Special Requests BOTTLES DRAWN AEROBIC AND ANAEROBIC 10CC EACH  Final   Culture   Final    NO GROWTH 2 DAYS Performed at [facility]    Report Status PENDING  Incomplete  Culture, sputum-assessment     Status: None   Collection Time: 10/29/15  9:10 PM  Result Value Ref Range Status   Specimen Description SPUTUM  Final   Special Requests NONE  Final   Sputum evaluation   Final    MICROSCOPIC FINDINGS SUGGEST THAT THIS SPECIMEN IS NOT REPRESENTATIVE OF LOWER RESPIRATORY SECRETIONS. PLEASE RECOLLECT. NOTIFIED S.PICKETT AT 2137 ON 10/29/15    Report Status 10/29/2015 FINAL  Final    Medical History: Past Medical History  Diagnosis Date  . Throat cancer ([place]) 2010  . Breast cancer ([place]) 2010    left   . Lung cancer ([place]) 2012  . Breast cancer ([place]) 2013    right  . Hypothyroidism   . HTN (hypertension)     Assessment: 57 yoF known to pharmacy for heparin dosing for PE.  Now being consulted to dose vancomycin and zosyn for pneumonia. CXR suggestive of acute cavitary pneumonia.  Zosyn 3.375g x 1 ordered in ED.  - WBC elevated but trending down - SCr has gone down, CrCl 36 ml/min - Will order vancomycin trough prior to fifth dose.   11/4 >> Vanc >> 11/4 >> Zosyn >>  11/4 blood: NGTD Sputum: needs re-collected Strep Ag: neg Legionella Ag: neg    Goal of Therapy:  Vancomycin trough level 15-20 mcg/ml  Doses adjusted per renal function Eradication of infection  Plan:  1.  Vancomycin 750 mg IV x 1 then 500 mg q24h. 2.  Zosyn 3.375g IV q8h (4 hour infusion time).  3.  F/u SCr, vanc trough levels, clinical course. 4. Consider vancomycin trough on  11/9 if continued.  Royetta Asal, PharmD, BCPS Pager [phone] 11/02/2015 10:12 AM

## 2015-11-02 NOTE — Progress Notes (Signed)
Went over all discharge information with patient and family.  Explained the importance of taking medications as prescribed.  All questions answers.  Prescriptions and AVS summary given.  VSS.  PT refused HH Pt, stated she already has 24/7 care.  Pt wheeled out by NT.

## 2015-11-02 NOTE — Discharge Summary (Signed)
Physician Discharge Summary  Lichelle Viets EQA:[id number] DOB: 06-15-1931 DOA: 10/29/2015  PCP: Arnette Norris, MD  Admit date: 10/29/2015 Discharge date: 11/02/2015  Time spent: 25 minutes  Recommendations for Outpatient Follow-up:  1. Follow up PCP in 2 weeks  Discharge Diagnoses:  Principal Problem:   Cavitary pneumonia Active Problems:   HTN (hypertension)   Multiple lung nodules   Pulmonary embolism (HCC)   Metastatic breast cancer [facility])   Discharge Condition: Stable  Diet recommendation: Low salt diet  Filed Weights   10/29/15 2042  Weight: 42.3 kg (93 lb 4.1 oz)    History of present illness:  81 y.o. female with extensive PMH including BRCA with metastatic disease to her R lung, radiation therapy to R lung. Patient presents to ED with c/o worsening SOB over the past month, has generalized weakness, sweating, cough productive of bloody sputum. Recent outpatient work up was positive for leukocytosis and PNA. She was treated with levaquin as outpatient but this has not improved symptoms other than to cause her hemoptysis to resolved. She has been afebrile. WBC improved to 14,000   Hospital Course:  1. Cavitary pneumonia- patient started on vancomycin and Zosyn for failed treatment with Levaquin. WBC is improving today white count is 14.7 down from 18.3 on admission. Urine for  Legionella antigen and strep pneumo antigen  negative. Patient currently not requiring oxygen, she is slowly improving.  No dyspnea on exertion, will discharge home on Augmentin 875  Mg po BID x 7 more days. 2. Pulmonary embolism- CT angiogram chest performed showed small nonobstructive segmental sized embolus in the left frontal lobe. Patient was started on  heparin. Changed heparin to Lovenox 40 mg every 12 hours, called and discussed with patients oncologist Dr Sonny Dandy who recommeds to start Xarelto instead of Lovenox. Will discharge her on Xarelto starter pack. 3. Metastatic breast cancer- patient is  status post mastectomy and followed by adjuvant chemotherapy. Currently on anti-estrogen therapy Femara. As per patient she has completed the therapy and no further chemotherapy as per oncology. Called and discussed with patient's oncologist Dr. Sonny Dandy who saw the patient in the hospital, patient does not want to be on hospice. Will discharge on HHPT  Procedures:  None  Consultations:  None  Discharge Exam: Filed Vitals:   11/02/15 0537  BP: 154/56  Pulse: 93  Temp: 98.4 F (36.9 C)  Resp: 20    General: Appears in no acute distress Cardiovascular: S1S2 RRR Respiratory: Clear bilaterally  Discharge Instructions   Discharge Instructions    Diet - low sodium heart healthy    Complete by:  As directed      Increase activity slowly    Complete by:  As directed           Current Discharge Medication List    START taking these medications   Details  amoxicillin-clavulanate (AUGMENTIN) 875-125 MG tablet Take 1 tablet by mouth 2 (two) times daily. Qty: 14 tablet, Refills: 0    enoxaparin (LOVENOX) 60 MG/0.6ML injection Inject 0.6 mLs (60 mg total) into the skin daily. Qty: 30 Syringe, Refills: 2    feeding supplement, ENSURE ENLIVE, (ENSURE ENLIVE) LIQD Take 237 mLs by mouth 2 (two) times daily between meals. Qty: 237 mL, Refills: 12    guaiFENesin (MUCINEX) 600 MG 12 hr tablet Take 2 tablets (1,200 mg total) by mouth 2 (two) times daily. Qty: 20 tablet, Refills: 0    ipratropium-albuterol (DUONEB) 0.5-2.5 (3) MG/3ML SOLN Take 3 mLs by nebulization 3 (three) times  daily. Qty: 360 mL, Refills: 1      CONTINUE these medications which have NOT CHANGED   Details  acetaminophen (TYLENOL) 325 MG tablet Take 650 mg by mouth 2 (two) times daily. Take 1 tablet with oxycodone 2x a day    alendronate (FOSAMAX) 35 MG tablet TAKE 1 TABLET BY MOUTH EVERY 7 DAYS. TAKE WITH A FULL GLASS OF WATER ON AN EMPTY STOMACH Qty: 48 tablet, Refills: 0    cetirizine (ZYRTEC) 5 MG tablet  Take 1 tablet (5 mg total) by mouth daily. Qty: 30 tablet    Cyanocobalamin (VITAMIN B 12 PO) Take 1 tablet by mouth daily.    Doxylamine Succinate, Sleep, (SLEEP AID PO) Take 1 tablet by mouth at bedtime as needed (sleep).    ibuprofen (ADVIL,MOTRIN) 200 MG tablet Take 200 mg by mouth every 6 (six) hours as needed for moderate pain.     letrozole (FEMARA) 2.5 MG tablet TAKE 1 TABLET (2.5 MG TOTAL) BY MOUTH DAILY. Qty: 90 tablet, Refills: 3   Associated Diagnoses: Malignant neoplasm of right female breast, unspecified site of breast (HCC)    levothyroxine (SYNTHROID, LEVOTHROID) 100 MCG tablet TAKE 1 TABLET (100 MCG TOTAL) BY MOUTH DAILY BEFORE BREAKFAST. Qty: 30 tablet, Refills: 11    oxyCODONE (OXY IR/ROXICODONE) 5 MG immediate release tablet Take 1 tablet (5 mg total) by mouth 2 (two) times daily as needed for severe pain. Qty: 60 tablet, Refills: 0    pilocarpine (SALAGEN) 5 MG tablet TAKE 1 TABLET (5 MG TOTAL) BY MOUTH EVERY 12 (TWELVE) HOURS. Qty: 60 tablet, Refills: 1      STOP taking these medications     guaifenesin (HUMIBID E) 400 MG TABS tablet      Ipratropium-Albuterol (COMBIVENT) 20-100 MCG/ACT AERS respimat        No Known Allergies Follow-up Information    Follow up with Arnette Norris, MD In 2 weeks.   Specialty:  Family Medicine   Contact information:   [place] [id number] [phone]        The results of significant diagnostics from this hospitalization (including imaging, microbiology, ancillary and laboratory) are listed below for reference.    Significant Diagnostic Studies: Dg Chest 2 View  10/21/2015  CLINICAL DATA:  Bilateral breast cancer. Lung cancer. Shortness of breath and hemoptysis. EXAM: CHEST  2 VIEW COMPARISON:  01/12/2015 chest radiograph. FINDINGS: Stable cardiomediastinal silhouette with top-normal heart size and right shifted trachea. There is a chronic right hydropneumothorax, with a stable small basilar  pleural effusion component and a small to moderate (10%) apical pneumothorax component that appears mildly increased since 01/12/2015. No left pneumothorax. No left pleural effusion. There is bandlike peripheral reticulation and consolidation in the left upper lung, slightly increased. There is extensive thick reticulonodular and patchy opacity throughout the mid to upper right lung with worsened volume loss in this location, and with worsened consolidation at the medial right lung base. Stable L1 chronic vertebral body compression fracture status post vertebroplasty. IMPRESSION: 1. Small moderate chronic right hydro pneumothorax, with stable small basilar right pleural effusion component and mildly increased small to moderate apical right pneumothorax component. No new mediastinal shift. 2. Worsened consolidation at the medial right lung base, which could represent pneumonia or alveolar hemorrhage. 3. Chronic thick reticulonodular and patchy opacities throughout the mid to upper right lung with worsened volume loss in this location, suggesting atelectasis superimposed on nonspecific chronic opacities that could represent posttreatment change. 4. Slightly increased bandlike peripheral reticulation  and consolidation in the left upper lung, suggestive of post treatment change and/or chronic organizing pneumonia. These results will be called to the ordering clinician or representative by the Radiologist Assistant, and communication documented in the PACS or zVision Dashboard. Electronically Signed   By: Ilona Sorrel M.D.   On: 10/21/2015 13:18   Ct Angio Chest Pe W/cm &/or Wo Cm  10/29/2015  CLINICAL DATA:  81 year old female with increasing shortness of breath and weakness. History of left-sided breast cancer diagnosed in 2010 and lung cancer diagnosed 2012, with recurrent right-sided breast cancer diagnosed again in 2013. EXAM: CT ANGIOGRAPHY CHEST WITH CONTRAST TECHNIQUE: Multidetector CT imaging of the chest was  performed using the standard protocol during bolus administration of intravenous contrast. Multiplanar CT image reconstructions and MIPs were obtained to evaluate the vascular anatomy. CONTRAST:  160m OMNIPAQUE IOHEXOL 350 MG/ML SOLN COMPARISON:  PET-CT 01/11/2015. FINDINGS: Mediastinum/Lymph Nodes: Image 882of series 10 demonstrates a nonocclusive filling defect in a segmental sized branch to the left upper lobe. No other larger more central filling defects are noted. Heart size is normal. There is no significant pericardial fluid, thickening or pericardial calcification. Enlarged subcarinal lymph node measuring 1.4 cm in short axis. Esophagus is unremarkable in appearance. Slight rightward shift of all cardiomediastinal structures related to chronic right-sided volume loss. No axillary lymphadenopathy. Numerous surgical clips in the left axilla, presumably from prior lymph node dissection. Lungs/Pleura: Severe chronic fibrotic changes again noted throughout the right lung where there is widespread varicose and cystic bronchiectasis. The overall appearance of the right hemithorax is most compatible with chronic fibrothorax, including the presence of a chronic pneumothorax which is notable in the right apex, only slightly larger than remote prior study 01/11/2015. Extensive consolidative changes in the right lower lobe are noted, where there is a new large cavitary area measuring 4.2 x 3.9 cm (image 53 of series 11). Small right pleural effusion. Fibrotic changes in the apex of the left upper lobe and anterior aspect of the left upper lobe, presumably related to prior left-sided breast and axilla radiation therapy. Pleural-based sessile nodule in the medial aspect of the left lower lobe (image 70 of series 4) new compared to the prior study, measuring 2.9 x 1.2 cm. Upper Abdomen: Large 3.9 x 6.8 cm low-attenuation lesion in the superior aspect of the spleen appears progressively increased in size compared to prior  examinations, suspicious for a cystic metastasis. Musculoskeletal/Soft Tissues: Chronic compression fracture of L1 with post vertebroplasty changes. Old healed fractures of the lateral aspect of the right seventh and eighth ribs. There are no aggressive appearing lytic or blastic lesions noted in the visualized portions of the skeleton. Review of the MIP images confirms the above findings. IMPRESSION: 1. Small nonobstructive segmental sized embolus to the left upper lobe. 2. Progressively worsening right-sided fibrothorax with widespread varicose and cystic bronchiectasis throughout the right lung, including a new large 4.2 x 3.9 cm cystic area in the right lower lobe in the midst of an area of consolidation. Clinical correlation for signs and symptoms of acute cavitary pneumonia is recommended. There is also small right-sided parapneumonic pleural effusion. 3. New sessile pleural-based nodule measuring 2.9 x 1.2 cm in the medial left lower lobe, concerning for potential neoplasm (either primary neoplasm or metastatic). 4. Enlarging cystic lesion in the spleen concerning for potential cystic splenic metastasis. 5. Additional incidental findings, as above. Electronically Signed   By: DVinnie LangtonM.D.   On: 10/29/2015 18:30    Microbiology: Recent Results (  from the past 240 hour(s))  Blood culture (routine x 2)     Status: None (Preliminary result)   Collection Time: 10/29/15  7:10 PM  Result Value Ref Range Status   Specimen Description BLOOD RIGHT ANTECUBITAL  Final   Special Requests BOTTLES DRAWN AEROBIC AND ANAEROBIC 5CC  Final   Culture   Final    NO GROWTH 3 DAYS Performed at [facility]    Report Status PENDING  Incomplete  Blood culture (routine x 2)     Status: None (Preliminary result)   Collection Time: 10/29/15  8:00 PM  Result Value Ref Range Status   Specimen Description BLOOD LEFT ARM  Final   Special Requests BOTTLES DRAWN AEROBIC AND ANAEROBIC 10CC EACH  Final    Culture   Final    NO GROWTH 2 DAYS Performed at [facility]    Report Status PENDING  Incomplete  Culture, sputum-assessment     Status: None   Collection Time: 10/29/15  9:10 PM  Result Value Ref Range Status   Specimen Description SPUTUM  Final   Special Requests NONE  Final   Sputum evaluation   Final    MICROSCOPIC FINDINGS SUGGEST THAT THIS SPECIMEN IS NOT REPRESENTATIVE OF LOWER RESPIRATORY SECRETIONS. PLEASE RECOLLECT. NOTIFIED S.PICKETT AT 2137 ON 10/29/15    Report Status 10/29/2015 FINAL  Final     Labs: Basic Metabolic Panel:  Recent Labs Lab 10/29/15 1715 10/31/15 0529 11/01/15 0203 11/01/15 1915  NA 136 138 136  --   K 4.0 4.1 4.2  --   CL 97* 99* 100*  --   CO2 30 31 30   --   GLUCOSE 99 109* 118*  --   BUN 24* 15 16  --   CREATININE 0.94 0.71 0.74  --   CALCIUM 9.1 8.6* 8.6*  --   MG  --   --   --  1.9   Liver Function Tests:  Recent Labs Lab 10/29/15 1715  AST 18  ALT 14  ALKPHOS 86  BILITOT 0.6  PROT 7.9  ALBUMIN 3.1*   No results for input(s): LIPASE, AMYLASE in the last 168 hours. No results for input(s): AMMONIA in the last 168 hours. CBC:  Recent Labs Lab 10/29/15 1114 10/29/15 1715 10/30/15 1314 10/31/15 0529 11/01/15 0203  WBC 23.6 Repeated and verified X2.* 19.6* 18.3* 12.9* 14.7*  NEUTROABS 21.7* 16.9*  --   --   --   HGB 11.1* 11.0* 10.0* 10.0* 9.8*  HCT 35.3* 34.0* 30.9* 31.5* 30.8*  MCV 92.8 92.9 92.8 93.8 93.3  PLT 409.0* 361 330 360 333    CBG:  Recent Labs Lab 11/02/15 0745  GLUCAP 110*    Signed:  Julius Boniface S  [facility] 11/02/2015, 10:44 AM

## 2015-11-03 ENCOUNTER — Telehealth: Payer: Self-pay | Admitting: Internal Medicine

## 2015-11-03 DIAGNOSIS — J984 Other disorders of lung: Secondary | ICD-10-CM

## 2015-11-03 DIAGNOSIS — J189 Pneumonia, unspecified organism: Secondary | ICD-10-CM

## 2015-11-03 DIAGNOSIS — J9801 Acute bronchospasm: Secondary | ICD-10-CM

## 2015-11-03 LAB — CULTURE, BLOOD (ROUTINE X 2): Culture: NO GROWTH

## 2015-11-03 MED ORDER — IPRATROPIUM-ALBUTEROL 0.5-2.5 (3) MG/3ML IN SOLN: 3.0000 mL | Freq: Three times a day (TID) | RESPIRATORY_TRACT | Status: DC

## 2015-11-03 NOTE — Telephone Encounter (Signed)
Patient: Veronica Pierce  OEH:[id number]  DOB: 17-Apr-1931  DOA: '@[email or web address]'$ @ Pt discharged yesterday by Dr Darrick Meigs, Prescription needed for Duoneb with ICD 10 code.  ipratropium-albuterol (DUONEB) 0.5-2.5 (3) MG/3ML SOLN 3 mL, 3 times daily 0 ordered Reorder   Patient Sig: Take 3 mLs by nebulization 3 (three) times daily. For 1 week and then as needed every 12 hours   Ordered on: 11/03/2015   Authorized by: Lavina Hamman   Dispense: 360 mL   Admin Instructions: For 1 week and then as needed every 12 hours   Codes for bronchospasm J98.01 Codes for cavitary pneumonia J18.9, J98.4  Author: Berle Mull, MD [facility] 11/03/2015 12:27 PM

## 2015-11-04 ENCOUNTER — Ambulatory Visit: Payer: Medicare Other | Admitting: Hematology and Oncology

## 2015-11-04 ENCOUNTER — Telehealth: Payer: Self-pay | Admitting: *Deleted

## 2015-11-04 LAB — CULTURE, BLOOD (ROUTINE X 2): Culture: NO GROWTH

## 2015-11-04 NOTE — Telephone Encounter (Signed)
Transition Care Management Follow-up Telephone Call   Date discharged? 11/02/15   How have you been since you were released from the hospital? Improving more and more daily, states she feels stronger   Do you understand why you were in the hospital? yes   Do you understand the discharge instructions? yes   Where were you discharged to? Home   Items Reviewed:  Medications reviewed: yes  Allergies reviewed: yes  Dietary changes reviewed: no  Referrals reviewed: no   Functional Questionnaire:   Activities of Daily Living (ADLs):   She states they are independent in the following: ambulation, bathing and hygiene, feeding, continence, grooming, toileting and dressing States they require assistance with the following: None   Any transportation issues/concerns?: no   Any patient concerns? no   Confirmed importance and date/time of follow-up visits scheduled yes, 11/09/15 @ 1100  Provider Appointment booked with Arnette Norris, MD  Confirmed with patient if condition begins to worsen call PCP or go to the ER.  Patient was given the office number and encouraged to call back with question or concerns.  : yes

## 2015-11-05 ENCOUNTER — Ambulatory Visit: Payer: Medicare Other | Admitting: Hematology and Oncology

## 2015-11-09 ENCOUNTER — Encounter: Payer: Self-pay | Admitting: Family Medicine

## 2015-11-09 ENCOUNTER — Emergency Department (HOSPITAL_COMMUNITY): Payer: Medicare Other

## 2015-11-09 ENCOUNTER — Encounter (HOSPITAL_COMMUNITY): Payer: Self-pay | Admitting: *Deleted

## 2015-11-09 ENCOUNTER — Ambulatory Visit (INDEPENDENT_AMBULATORY_CARE_PROVIDER_SITE_OTHER): Payer: Medicare Other | Admitting: Family Medicine

## 2015-11-09 ENCOUNTER — Emergency Department (HOSPITAL_COMMUNITY)
Admission: EM | Admit: 2015-11-09 | Discharge: 2015-11-09 | Disposition: A | Discharge status: Home / Self Care | Payer: Medicare Other | Attending: Physician Assistant | Admitting: Physician Assistant

## 2015-11-09 ENCOUNTER — Telehealth: Payer: Self-pay | Admitting: *Deleted

## 2015-11-09 VITALS — BP 122/68 | HR 103 | Temp 98.0°F | Wt 89.8 lb

## 2015-11-09 DIAGNOSIS — Z7901 Long term (current) use of anticoagulants: Secondary | ICD-10-CM | POA: Diagnosis not present

## 2015-11-09 DIAGNOSIS — Z853 Personal history of malignant neoplasm of breast: Secondary | ICD-10-CM | POA: Diagnosis not present

## 2015-11-09 DIAGNOSIS — C3490 Malignant neoplasm of unspecified part of unspecified bronchus or lung: Secondary | ICD-10-CM | POA: Diagnosis not present

## 2015-11-09 DIAGNOSIS — C799 Secondary malignant neoplasm of unspecified site: Secondary | ICD-10-CM

## 2015-11-09 DIAGNOSIS — J189 Pneumonia, unspecified organism: Secondary | ICD-10-CM | POA: Diagnosis not present

## 2015-11-09 DIAGNOSIS — I10 Essential (primary) hypertension: Secondary | ICD-10-CM | POA: Insufficient documentation

## 2015-11-09 DIAGNOSIS — C50919 Malignant neoplasm of unspecified site of unspecified female breast: Secondary | ICD-10-CM

## 2015-11-09 DIAGNOSIS — R0602 Shortness of breath: Secondary | ICD-10-CM | POA: Insufficient documentation

## 2015-11-09 DIAGNOSIS — Z79899 Other long term (current) drug therapy: Secondary | ICD-10-CM | POA: Insufficient documentation

## 2015-11-09 DIAGNOSIS — Z85818 Personal history of malignant neoplasm of other sites of lip, oral cavity, and pharynx: Secondary | ICD-10-CM | POA: Insufficient documentation

## 2015-11-09 DIAGNOSIS — J9801 Acute bronchospasm: Secondary | ICD-10-CM

## 2015-11-09 DIAGNOSIS — E039 Hypothyroidism, unspecified: Secondary | ICD-10-CM | POA: Diagnosis not present

## 2015-11-09 DIAGNOSIS — C801 Malignant (primary) neoplasm, unspecified: Secondary | ICD-10-CM

## 2015-11-09 DIAGNOSIS — J984 Other disorders of lung: Secondary | ICD-10-CM | POA: Diagnosis not present

## 2015-11-09 DIAGNOSIS — D72829 Elevated white blood cell count, unspecified: Secondary | ICD-10-CM | POA: Diagnosis present

## 2015-11-09 DIAGNOSIS — I2699 Other pulmonary embolism without acute cor pulmonale: Secondary | ICD-10-CM

## 2015-11-09 LAB — CBC WITH DIFFERENTIAL/PLATELET
BASOS ABS: 0.1 10*3/uL (ref 0.0–0.1)
BASOS PCT: 0.2 % (ref 0.0–3.0)
Basophils Absolute: 0 10*3/uL (ref 0.0–0.1)
Basophils Relative: 0 %
EOS PCT: 0.6 % (ref 0.0–5.0)
Eosinophils Absolute: 0.1 10*3/uL (ref 0.0–0.7)
Eosinophils Absolute: 0.3 10*3/uL (ref 0.0–0.7)
Eosinophils Relative: 2 %
HCT: 34.2 % — ABNORMAL LOW (ref 36.0–46.0)
HEMATOCRIT: 29.6 % — AB (ref 36.0–46.0)
Hemoglobin: 11 g/dL — ABNORMAL LOW (ref 12.0–15.0)
Hemoglobin: 9.7 g/dL — ABNORMAL LOW (ref 12.0–15.0)
LYMPHS ABS: 1.1 10*3/uL (ref 0.7–4.0)
LYMPHS PCT: 7 %
Lymphocytes Relative: 5.1 % — ABNORMAL LOW (ref 12.0–46.0)
Lymphs Abs: 0.9 10*3/uL (ref 0.7–4.0)
MCH: 29.8 pg (ref 26.0–34.0)
MCHC: 32.1 g/dL (ref 30.0–36.0)
MCHC: 32.8 g/dL (ref 30.0–36.0)
MCV: 92.1 fl (ref 78.0–100.0)
MCV: 91.1 fL (ref 78.0–100.0)
MONOS PCT: 5.9 % (ref 3.0–12.0)
Monocytes Absolute: 1.3 10*3/uL — ABNORMAL HIGH (ref 0.1–1.0)
Monocytes Absolute: 1 10*3/uL (ref 0.1–1.0)
Monocytes Relative: 7 %
NEUTROS ABS: 18.8 10*3/uL — AB (ref 1.4–7.7)
NEUTROS ABS: 11.8 10*3/uL — AB (ref 1.7–7.7)
Neutrophils Relative %: 84 %
Platelets: 518 10*3/uL — ABNORMAL HIGH (ref 150.0–400.0)
Platelets: 429 10*3/uL — ABNORMAL HIGH (ref 150–400)
RBC: 3.72 Mil/uL — AB (ref 3.87–5.11)
RBC: 3.25 MIL/uL — AB (ref 3.87–5.11)
RDW: 14.2 % (ref 11.5–15.5)
RDW: 14 % (ref 11.5–15.5)
WBC: 21.3 10*3/uL (ref 4.0–10.5)
WBC: 14.1 10*3/uL — AB (ref 4.0–10.5)

## 2015-11-09 LAB — COMPREHENSIVE METABOLIC PANEL
ALK PHOS: 80 U/L (ref 39–117)
ALT: 15 U/L (ref 0–35)
ALT: 15 U/L (ref 14–54)
AST: 18 U/L (ref 0–37)
AST: 26 U/L (ref 15–41)
Albumin: 3.2 g/dL — ABNORMAL LOW (ref 3.5–5.2)
Albumin: 2.6 g/dL — ABNORMAL LOW (ref 3.5–5.0)
Alkaline Phosphatase: 71 U/L (ref 38–126)
Anion gap: 10 (ref 5–15)
BILIRUBIN TOTAL: 0.7 mg/dL (ref 0.3–1.2)
BUN: 21 mg/dL (ref 6–23)
BUN: 21 mg/dL — AB (ref 6–20)
CO2: 30 mEq/L (ref 19–32)
CO2: 28 mmol/L (ref 22–32)
CREATININE: 0.72 mg/dL (ref 0.44–1.00)
Calcium: 9.8 mg/dL (ref 8.4–10.5)
Calcium: 8.8 mg/dL — ABNORMAL LOW (ref 8.9–10.3)
Chloride: 94 mEq/L — ABNORMAL LOW (ref 96–112)
Chloride: 97 mmol/L — ABNORMAL LOW (ref 101–111)
Creatinine, Ser: 0.75 mg/dL (ref 0.40–1.20)
GFR calc Af Amer: >60 mL/min (ref >60)
GFR: 78.53 mL/min (ref >60.00)
GLUCOSE: 103 mg/dL — AB (ref 70–99)
Glucose, Bld: 105 mg/dL — ABNORMAL HIGH (ref 65–99)
POTASSIUM: 4.5 meq/L (ref 3.5–5.1)
Potassium: 4.3 mmol/L (ref 3.5–5.1)
Sodium: 132 mEq/L — ABNORMAL LOW (ref 135–145)
Sodium: 135 mmol/L (ref 135–145)
TOTAL PROTEIN: 8 g/dL (ref 6.0–8.3)
TOTAL PROTEIN: 7.4 g/dL (ref 6.5–8.1)
Total Bilirubin: 0.4 mg/dL (ref 0.2–1.2)

## 2015-11-09 LAB — URINE MICROSCOPIC-ADD ON: Bacteria, UA: NONE SEEN

## 2015-11-09 LAB — I-STAT CG4 LACTIC ACID, ED: Lactic Acid, Venous: 0.8 mmol/L (ref 0.5–2.0)

## 2015-11-09 LAB — URINALYSIS, ROUTINE W REFLEX MICROSCOPIC
Bilirubin Urine: NEGATIVE
GLUCOSE, UA: NEGATIVE mg/dL
KETONES UR: NEGATIVE mg/dL
Nitrite: NEGATIVE
PROTEIN: 30 mg/dL — AB
Specific Gravity, Urine: 1.014 (ref 1.005–1.030)
pH: 6 (ref 5.0–8.0)

## 2015-11-09 LAB — LIPASE, BLOOD: LIPASE: 25 U/L (ref 11–51)

## 2015-11-09 LAB — I-STAT TROPONIN, ED: Troponin i, poc: 0 ng/mL (ref 0.00–0.08)

## 2015-11-09 MED ORDER — OXYCODONE HCL 5 MG PO TABS: 5.0000 mg | ORAL_TABLET | Freq: Two times a day (BID) | ORAL | Status: DC | PRN

## 2015-11-09 MED ORDER — SODIUM CHLORIDE 0.9 % IV BOLUS (SEPSIS)
500.0000 mL | Freq: Once | INTRAVENOUS | Status: AC
  Administered 2015-11-09: 500 mL via INTRAVENOUS

## 2015-11-09 NOTE — Progress Notes (Signed)
Pre visit review using our clinic review tool, if applicable. No additional management support is needed unless otherwise documented below in the visit note. 

## 2015-11-09 NOTE — Telephone Encounter (Signed)
Call pt:  Dr. Deborra Medina wants her to go to ER

## 2015-11-09 NOTE — Progress Notes (Signed)
Subjective:   Patient ID: Nyhla Pierce, female    DOB: 11-01-1931, 81 y.o.   MRN: [id number]  Veronica Pierce is a pleasant 81 y.o. year old female who presents to clinic today with Hospitalization Follow-up  on 11/09/2015  HPI:  Admitted 11/4- 11/02/2015.  Notes reviewed.  Presented with worsening SOB over past month along with hemoptysis and weakness.  Was being treated with Levaquin for PNA.  Started on IV Vanc and Zosyn for presumed failed outpt treatment with Levaquin.  WBC trended down.  Legionella Ag and strep pneumo neg. Sent home with Augmentin 875 mg twice daily x 7 more days.  CT angio also showed small PE- discharged home on Xarelto. She is followed by Dr. Sonny Dandy, her oncologist.  Discharged home with HHPT.  Feels ok- appetite not great. Drinking ensure 3 times a day. Wt Readings from Last 3 Encounters:  11/09/15 89 lb 12 oz (40.71 kg)  10/29/15 93 lb 4.1 oz (42.3 kg)  10/21/15 91 lb 1.9 oz (41.332 kg)   Still coughing up blood but only in the mornings. SOB has improved. Sleeping ok.  Recent Results (from the past 2160 hour(s))  Vitamin B12     Status: Abnormal   Collection Time: 10/15/15 10:37 AM  Result Value Ref Range   Vitamin B-12 1001 (H) 211 - 911 pg/mL  CBC with Differential/Platelet     Status: Abnormal   Collection Time: 10/21/15 11:16 AM  Result Value Ref Range   WBC 20.7 (HH) 4.0 - 10.5 K/uL   RBC 4.22 3.87 - 5.11 Mil/uL   Hemoglobin 12.6 12.0 - 15.0 g/dL   HCT 39.1 36.0 - 46.0 %   MCV 92.8 78.0 - 100.0 fl   MCHC 32.3 30.0 - 36.0 g/dL   RDW 13.4 11.5 - 15.5 %   Platelets 476.0 (H) 150.0 - 400.0 K/uL   Neutrophils Relative % 90.2 (H) 43.0 - 77.0 %   Lymphocytes Relative 3.3 (L) 12.0 - 46.0 %   Monocytes Relative 6.3 3.0 - 12.0 %   Eosinophils Relative 0.2 0.0 - 5.0 %   Basophils Relative 0.0 0.0 - 3.0 %   Neutro Abs 18.6 (H) 1.4 - 7.7 K/uL   Lymphs Abs 0.7 0.7 - 4.0 K/uL   Monocytes Absolute 1.3 (H) 0.1 - 1.0 K/uL   Eosinophils Absolute  0.0 0.0 - 0.7 K/uL   Basophils Absolute 0.0 0.0 - 0.1 K/uL  Basic metabolic panel     Status: Abnormal   Collection Time: 10/21/15 11:16 AM  Result Value Ref Range   Sodium 137 135 - 145 mEq/L   Potassium 4.3 3.5 - 5.1 mEq/L   Chloride 98 96 - 112 mEq/L   CO2 31 19 - 32 mEq/L   Glucose, Bld 119 (H) 70 - 99 mg/dL   BUN 19 6 - 23 mg/dL   Creatinine, Ser 0.79 0.40 - 1.20 mg/dL   Calcium 10.1 8.4 - 10.5 mg/dL   GFR 73.97 >60.00 mL/min  CBC with Differential/Platelet     Status: Abnormal   Collection Time: 10/29/15 11:14 AM  Result Value Ref Range   WBC 23.6 Repeated and verified X2. (HH) 4.0 - 10.5 K/uL   RBC 3.80 (L) 3.87 - 5.11 Mil/uL   Hemoglobin 11.1 (L) 12.0 - 15.0 g/dL   HCT 35.3 (L) 36.0 - 46.0 %   MCV 92.8 78.0 - 100.0 fl   MCHC 31.6 30.0 - 36.0 g/dL   RDW 13.4 11.5 - 15.5 %   Platelets 409.0 (H)  150.0 - 400.0 K/uL   Neutrophils Relative % 92.1 Repeated and verified X2. (H) 43.0 - 77.0 %   Lymphocytes Relative 2.5 (L) 12.0 - 46.0 %   Monocytes Relative 5.2 3.0 - 12.0 %   Eosinophils Relative 0.2 0.0 - 5.0 %   Basophils Relative 0.0 0.0 - 3.0 %   Neutro Abs 21.7 (H) 1.4 - 7.7 K/uL   Lymphs Abs 0.6 (L) 0.7 - 4.0 K/uL   Monocytes Absolute 1.2 (H) 0.1 - 1.0 K/uL   Eosinophils Absolute 0.0 0.0 - 0.7 K/uL   Basophils Absolute 0.0 0.0 - 0.1 K/uL  CBC with Differential/Platelet     Status: Abnormal   Collection Time: 10/29/15  5:15 PM  Result Value Ref Range   WBC 19.6 (H) 4.0 - 10.5 K/uL   RBC 3.66 (L) 3.87 - 5.11 MIL/uL   Hemoglobin 11.0 (L) 12.0 - 15.0 g/dL   HCT 34.0 (L) 36.0 - 46.0 %   MCV 92.9 78.0 - 100.0 fL   MCH 30.1 26.0 - 34.0 pg   MCHC 32.4 30.0 - 36.0 g/dL   RDW 13.1 11.5 - 15.5 %   Platelets 361 150 - 400 K/uL   Neutrophils Relative % 86 %   Neutro Abs 16.9 (H) 1.7 - 7.7 K/uL   Lymphocytes Relative 7 %   Lymphs Abs 1.3 0.7 - 4.0 K/uL   Monocytes Relative 6 %   Monocytes Absolute 1.1 (H) 0.1 - 1.0 K/uL   Eosinophils Relative 1 %   Eosinophils Absolute  0.2 0.0 - 0.7 K/uL   Basophils Relative 0 %   Basophils Absolute 0.0 0.0 - 0.1 K/uL  Comprehensive metabolic panel     Status: Abnormal   Collection Time: 10/29/15  5:15 PM  Result Value Ref Range   Sodium 136 135 - 145 mmol/L   Potassium 4.0 3.5 - 5.1 mmol/L   Chloride 97 (L) 101 - 111 mmol/L   CO2 30 22 - 32 mmol/L   Glucose, Bld 99 65 - 99 mg/dL   BUN 24 (H) 6 - 20 mg/dL   Creatinine, Ser 0.94 0.44 - 1.00 mg/dL   Calcium 9.1 8.9 - 10.3 mg/dL   Total Protein 7.9 6.5 - 8.1 g/dL   Albumin 3.1 (L) 3.5 - 5.0 g/dL   AST 18 15 - 41 U/L   ALT 14 14 - 54 U/L   Alkaline Phosphatase 86 38 - 126 U/L   Total Bilirubin 0.6 0.3 - 1.2 mg/dL   GFR calc non Af Amer 55 (L) >60 mL/min   GFR calc Af Amer >60 >60 mL/min    Comment: (NOTE) The eGFR has been calculated using the CKD EPI equation. This calculation has not been validated in all clinical situations. eGFR's persistently <60 mL/min signify possible Chronic Kidney Disease.    Anion gap 9 5 - 15  I-Stat CG4 Lactic Acid, ED     Status: None   Collection Time: 10/29/15  5:26 PM  Result Value Ref Range   Lactic Acid, Venous 1.40 0.5 - 2.0 mmol/L  Blood culture (routine x 2)     Status: None   Collection Time: 10/29/15  7:10 PM  Result Value Ref Range   Specimen Description BLOOD RIGHT ANTECUBITAL    Special Requests BOTTLES DRAWN AEROBIC AND ANAEROBIC 5CC    Culture      NO GROWTH 5 DAYS Performed at [facility]    Report Status 11/03/2015 FINAL   APTT     Status: Abnormal  Collection Time: 10/29/15  7:24 PM  Result Value Ref Range   aPTT 43 (H) 24 - 37 seconds    Comment:        IF BASELINE aPTT IS ELEVATED, SUGGEST PATIENT RISK ASSESSMENT BE USED TO DETERMINE APPROPRIATE ANTICOAGULANT THERAPY.   Protime-INR     Status: Abnormal   Collection Time: 10/29/15  7:24 PM  Result Value Ref Range   Prothrombin Time 15.8 (H) 11.6 - 15.2 seconds   INR 1.24 0.00 - 1.49  Heparin level (unfractionated)     Status: Abnormal    Collection Time: 10/29/15  7:24 PM  Result Value Ref Range   Heparin Unfractionated <0.10 (L) 0.30 - 0.70 IU/mL    Comment:        IF HEPARIN RESULTS ARE BELOW EXPECTED VALUES, AND PATIENT DOSAGE HAS BEEN CONFIRMED, SUGGEST FOLLOW UP TESTING OF ANTITHROMBIN III LEVELS.   Blood culture (routine x 2)     Status: None   Collection Time: 10/29/15  8:00 PM  Result Value Ref Range   Specimen Description BLOOD LEFT ARM    Special Requests BOTTLES DRAWN AEROBIC AND ANAEROBIC 10CC EACH    Culture      NO GROWTH 5 DAYS Performed at [facility]    Report Status 11/04/2015 FINAL   Culture, sputum-assessment     Status: None   Collection Time: 10/29/15  9:10 PM  Result Value Ref Range   Specimen Description SPUTUM    Special Requests NONE    Sputum evaluation      MICROSCOPIC FINDINGS SUGGEST THAT THIS SPECIMEN IS NOT REPRESENTATIVE OF LOWER RESPIRATORY SECRETIONS. PLEASE RECOLLECT. NOTIFIED S.PICKETT AT 2137 ON 10/29/15    Report Status 10/29/2015 FINAL   Urinalysis, Routine w reflex microscopic (not at [facility])     Status: Abnormal   Collection Time: 10/29/15  9:12 PM  Result Value Ref Range   Color, Urine YELLOW YELLOW   APPearance CLEAR CLEAR   Specific Gravity, Urine 1.034 (H) 1.005 - 1.030   pH 7.0 5.0 - 8.0   Glucose, UA NEGATIVE NEGATIVE mg/dL   Hgb urine dipstick NEGATIVE NEGATIVE   Bilirubin Urine NEGATIVE NEGATIVE   Ketones, ur NEGATIVE NEGATIVE mg/dL   Protein, ur NEGATIVE NEGATIVE mg/dL   Urobilinogen, UA 1.0 0.0 - 1.0 mg/dL   Nitrite NEGATIVE NEGATIVE   Leukocytes, UA NEGATIVE NEGATIVE    Comment: MICROSCOPIC NOT DONE ON URINES WITH NEGATIVE PROTEIN, BLOOD, LEUKOCYTES, NITRITE, OR GLUCOSE <1000 mg/dL.  Legionella Pneumophila Serogp 1 Ur Ag     Status: None   Collection Time: 10/29/15  9:12 PM  Result Value Ref Range   L. pneumophila Serogp 1 Ur Ag Negative Negative    Comment: (NOTE) Performed At: [facility] [address] [place], [place]  [id number] Lindon Romp MD RD:[id number]    Source of Sample URINE, CLEAN CATCH   Strep pneumoniae urinary antigen  (not at [facility])     Status: None   Collection Time: 10/29/15  9:12 PM  Result Value Ref Range   Strep Pneumo Urinary Antigen NEGATIVE NEGATIVE    Comment: Performed at [facility]        Infection due to S. pneumoniae cannot be absolutely ruled out since the antigen present may be below the detection limit of the test. Performed at [facility]   Heparin level (unfractionated)     Status: Abnormal   Collection Time: 10/30/15  4:05 AM  Result Value Ref Range   Heparin Unfractionated  0.10 (L) 0.30 - 0.70 IU/mL    Comment:        IF HEPARIN RESULTS ARE BELOW EXPECTED VALUES, AND PATIENT DOSAGE HAS BEEN CONFIRMED, SUGGEST FOLLOW UP TESTING OF ANTITHROMBIN III LEVELS.   Heparin level (unfractionated)     Status: Abnormal   Collection Time: 10/30/15  1:14 PM  Result Value Ref Range   Heparin Unfractionated <0.10 (L) 0.30 - 0.70 IU/mL    Comment:        IF HEPARIN RESULTS ARE BELOW EXPECTED VALUES, AND PATIENT DOSAGE HAS BEEN CONFIRMED, SUGGEST FOLLOW UP TESTING OF ANTITHROMBIN III LEVELS.   CBC     Status: Abnormal   Collection Time: 10/30/15  1:14 PM  Result Value Ref Range   WBC 18.3 (H) 4.0 - 10.5 K/uL   RBC 3.33 (L) 3.87 - 5.11 MIL/uL   Hemoglobin 10.0 (L) 12.0 - 15.0 g/dL   HCT 30.9 (L) 36.0 - 46.0 %   MCV 92.8 78.0 - 100.0 fL   MCH 30.0 26.0 - 34.0 pg   MCHC 32.4 30.0 - 36.0 g/dL   RDW 12.8 11.5 - 15.5 %   Platelets 330 150 - 400 K/uL  Heparin level (unfractionated)     Status: Abnormal   Collection Time: 10/30/15 11:33 PM  Result Value Ref Range   Heparin Unfractionated 0.16 (L) 0.30 - 0.70 IU/mL    Comment:        IF HEPARIN RESULTS ARE BELOW EXPECTED VALUES, AND PATIENT DOSAGE HAS BEEN CONFIRMED, SUGGEST FOLLOW UP TESTING OF ANTITHROMBIN III LEVELS.   CBC     Status: Abnormal   Collection Time: 10/31/15  5:29 AM  Result  Value Ref Range   WBC 12.9 (H) 4.0 - 10.5 K/uL   RBC 3.36 (L) 3.87 - 5.11 MIL/uL   Hemoglobin 10.0 (L) 12.0 - 15.0 g/dL   HCT 31.5 (L) 36.0 - 46.0 %   MCV 93.8 78.0 - 100.0 fL   MCH 29.8 26.0 - 34.0 pg   MCHC 31.7 30.0 - 36.0 g/dL   RDW 13.2 11.5 - 15.5 %   Platelets 360 150 - 400 K/uL  Basic metabolic panel     Status: Abnormal   Collection Time: 10/31/15  5:29 AM  Result Value Ref Range   Sodium 138 135 - 145 mmol/L   Potassium 4.1 3.5 - 5.1 mmol/L   Chloride 99 (L) 101 - 111 mmol/L   CO2 31 22 - 32 mmol/L   Glucose, Bld 109 (H) 65 - 99 mg/dL   BUN 15 6 - 20 mg/dL   Creatinine, Ser 0.71 0.44 - 1.00 mg/dL   Calcium 8.6 (L) 8.9 - 10.3 mg/dL   GFR calc non Af Amer >60 >60 mL/min   GFR calc Af Amer >60 >60 mL/min    Comment: (NOTE) The eGFR has been calculated using the CKD EPI equation. This calculation has not been validated in all clinical situations. eGFR's persistently <60 mL/min signify possible Chronic Kidney Disease.    Anion gap 8 5 - 15  Heparin level (unfractionated)     Status: None   Collection Time: 10/31/15  9:07 AM  Result Value Ref Range   Heparin Unfractionated 0.32 0.30 - 0.70 IU/mL    Comment:        IF HEPARIN RESULTS ARE BELOW EXPECTED VALUES, AND PATIENT DOSAGE HAS BEEN CONFIRMED, SUGGEST FOLLOW UP TESTING OF ANTITHROMBIN III LEVELS.   Heparin level (unfractionated)     Status: Abnormal   Collection Time: 10/31/15  5:00 PM  Result Value Ref Range   Heparin Unfractionated <0.10 (L) 0.30 - 0.70 IU/mL    Comment:        IF HEPARIN RESULTS ARE BELOW EXPECTED VALUES, AND PATIENT DOSAGE HAS BEEN CONFIRMED, SUGGEST FOLLOW UP TESTING OF ANTITHROMBIN III LEVELS.   Heparin level (unfractionated)     Status: None   Collection Time: 11/01/15  2:02 AM  Result Value Ref Range   Heparin Unfractionated 0.48 0.30 - 0.70 IU/mL    Comment:        IF HEPARIN RESULTS ARE BELOW EXPECTED VALUES, AND PATIENT DOSAGE HAS BEEN CONFIRMED, SUGGEST FOLLOW UP  TESTING OF ANTITHROMBIN III LEVELS.   CBC     Status: Abnormal   Collection Time: 11/01/15  2:03 AM  Result Value Ref Range   WBC 14.7 (H) 4.0 - 10.5 K/uL   RBC 3.30 (L) 3.87 - 5.11 MIL/uL   Hemoglobin 9.8 (L) 12.0 - 15.0 g/dL   HCT 30.8 (L) 36.0 - 46.0 %   MCV 93.3 78.0 - 100.0 fL   MCH 29.7 26.0 - 34.0 pg   MCHC 31.8 30.0 - 36.0 g/dL   RDW 13.2 11.5 - 15.5 %   Platelets 333 150 - 400 K/uL  Basic metabolic panel     Status: Abnormal   Collection Time: 11/01/15  2:03 AM  Result Value Ref Range   Sodium 136 135 - 145 mmol/L   Potassium 4.2 3.5 - 5.1 mmol/L   Chloride 100 (L) 101 - 111 mmol/L   CO2 30 22 - 32 mmol/L   Glucose, Bld 118 (H) 65 - 99 mg/dL   BUN 16 6 - 20 mg/dL   Creatinine, Ser 0.74 0.44 - 1.00 mg/dL   Calcium 8.6 (L) 8.9 - 10.3 mg/dL   GFR calc non Af Amer >60 >60 mL/min   GFR calc Af Amer >60 >60 mL/min    Comment: (NOTE) The eGFR has been calculated using the CKD EPI equation. This calculation has not been validated in all clinical situations. eGFR's persistently <60 mL/min signify possible Chronic Kidney Disease.    Anion gap 6 5 - 15  Magnesium     Status: None   Collection Time: 11/01/15  7:15 PM  Result Value Ref Range   Magnesium 1.9 1.7 - 2.4 mg/dL  Glucose, capillary     Status: Abnormal   Collection Time: 11/02/15  7:45 AM  Result Value Ref Range   Glucose-Capillary 110 (H) 65 - 99 mg/dL   Dg Chest 2 View  10/21/2015  CLINICAL DATA:  Bilateral breast cancer. Lung cancer. Shortness of breath and hemoptysis. EXAM: CHEST  2 VIEW COMPARISON:  01/12/2015 chest radiograph. FINDINGS: Stable cardiomediastinal silhouette with top-normal heart size and right shifted trachea. There is a chronic right hydropneumothorax, with a stable small basilar pleural effusion component and a small to moderate (10%) apical pneumothorax component that appears mildly increased since 01/12/2015. No left pneumothorax. No left pleural effusion. There is bandlike peripheral  reticulation and consolidation in the left upper lung, slightly increased. There is extensive thick reticulonodular and patchy opacity throughout the mid to upper right lung with worsened volume loss in this location, and with worsened consolidation at the medial right lung base. Stable L1 chronic vertebral body compression fracture status post vertebroplasty. IMPRESSION: 1. Small moderate chronic right hydro pneumothorax, with stable small basilar right pleural effusion component and mildly increased small to moderate apical right pneumothorax component. No new mediastinal shift. 2. Worsened consolidation at the medial  right lung base, which could represent pneumonia or alveolar hemorrhage. 3. Chronic thick reticulonodular and patchy opacities throughout the mid to upper right lung with worsened volume loss in this location, suggesting atelectasis superimposed on nonspecific chronic opacities that could represent posttreatment change. 4. Slightly increased bandlike peripheral reticulation and consolidation in the left upper lung, suggestive of post treatment change and/or chronic organizing pneumonia. These results will be called to the ordering clinician or representative by the Radiologist Assistant, and communication documented in the PACS or zVision Dashboard. Electronically Signed   By: Ilona Sorrel M.D.   On: 10/21/2015 13:18   Ct Angio Chest Pe W/cm &/or Wo Cm  10/29/2015  CLINICAL DATA:  81 year old female with increasing shortness of breath and weakness. History of left-sided breast cancer diagnosed in 2010 and lung cancer diagnosed 2012, with recurrent right-sided breast cancer diagnosed again in 2013. EXAM: CT ANGIOGRAPHY CHEST WITH CONTRAST TECHNIQUE: Multidetector CT imaging of the chest was performed using the standard protocol during bolus administration of intravenous contrast. Multiplanar CT image reconstructions and MIPs were obtained to evaluate the vascular anatomy. CONTRAST:  122m OMNIPAQUE  IOHEXOL 350 MG/ML SOLN COMPARISON:  PET-CT 01/11/2015. FINDINGS: Mediastinum/Lymph Nodes: Image 886of series 10 demonstrates a nonocclusive filling defect in a segmental sized branch to the left upper lobe. No other larger more central filling defects are noted. Heart size is normal. There is no significant pericardial fluid, thickening or pericardial calcification. Enlarged subcarinal lymph node measuring 1.4 cm in short axis. Esophagus is unremarkable in appearance. Slight rightward shift of all cardiomediastinal structures related to chronic right-sided volume loss. No axillary lymphadenopathy. Numerous surgical clips in the left axilla, presumably from prior lymph node dissection. Lungs/Pleura: Severe chronic fibrotic changes again noted throughout the right lung where there is widespread varicose and cystic bronchiectasis. The overall appearance of the right hemithorax is most compatible with chronic fibrothorax, including the presence of a chronic pneumothorax which is notable in the right apex, only slightly larger than remote prior study 01/11/2015. Extensive consolidative changes in the right lower lobe are noted, where there is a new large cavitary area measuring 4.2 x 3.9 cm (image 53 of series 11). Small right pleural effusion. Fibrotic changes in the apex of the left upper lobe and anterior aspect of the left upper lobe, presumably related to prior left-sided breast and axilla radiation therapy. Pleural-based sessile nodule in the medial aspect of the left lower lobe (image 70 of series 4) new compared to the prior study, measuring 2.9 x 1.2 cm. Upper Abdomen: Large 3.9 x 6.8 cm low-attenuation lesion in the superior aspect of the spleen appears progressively increased in size compared to prior examinations, suspicious for a cystic metastasis. Musculoskeletal/Soft Tissues: Chronic compression fracture of L1 with post vertebroplasty changes. Old healed fractures of the lateral aspect of the right seventh  and eighth ribs. There are no aggressive appearing lytic or blastic lesions noted in the visualized portions of the skeleton. Review of the MIP images confirms the above findings. IMPRESSION: 1. Small nonobstructive segmental sized embolus to the left upper lobe. 2. Progressively worsening right-sided fibrothorax with widespread varicose and cystic bronchiectasis throughout the right lung, including a new large 4.2 x 3.9 cm cystic area in the right lower lobe in the midst of an area of consolidation. Clinical correlation for signs and symptoms of acute cavitary pneumonia is recommended. There is also small right-sided parapneumonic pleural effusion. 3. New sessile pleural-based nodule measuring 2.9 x 1.2 cm in the medial left lower  lobe, concerning for potential neoplasm (either primary neoplasm or metastatic). 4. Enlarging cystic lesion in the spleen concerning for potential cystic splenic metastasis. 5. Additional incidental findings, as above. Electronically Signed   By: Vinnie Langton M.D.   On: 10/29/2015 18:30     Review of Systems  Constitutional: Positive for appetite change. Negative for fever.  HENT: Negative.   Respiratory: Positive for cough. Negative for shortness of breath and wheezing.   Cardiovascular: Negative.   Gastrointestinal: Negative.   Genitourinary: Negative.   Musculoskeletal: Positive for back pain.  Psychiatric/Behavioral: Negative.   All other systems reviewed and are negative.      Objective:    BP 122/68 mmHg  Pulse 103  Temp(Src) 98 F (36.7 C) (Oral)  Wt 89 lb 12 oz (40.71 kg)  SpO2 97%   Physical Exam  Constitutional: She is oriented to person, place, and time.  thin  HENT:  Head: Normocephalic.  Eyes: Conjunctivae are normal.  Neck: Normal range of motion.  Cardiovascular: Exam reveals no friction rub.   No murmur heard. Pulmonary/Chest: Effort normal. No respiratory distress. She has no wheezes. She has no rales.  Musculoskeletal: She  exhibits no edema.  Neurological: She is alert and oriented to person, place, and time. No cranial nerve deficit.  Skin: Skin is warm and dry.  Psychiatric: She has a normal mood and affect. Her behavior is normal. Judgment and thought content normal.  Nursing note and vitals reviewed.         Assessment & Plan:   Cavitary pneumonia  Metastatic breast cancer ([place])  Bronchospasm, acute  Other acute pulmonary embolism without acute cor pulmonale (HCC) No Follow-up on file.

## 2015-11-09 NOTE — ED Provider Notes (Signed)
CSN: [id number]     Arrival date & time 11/09/15  1630 History   First MD Initiated Contact with Patient 11/09/15 1651     Chief Complaint  Patient presents with  . Ca pt, Elevated WBC      (Consider location/radiation/quality/duration/timing/severity/associated sxs/prior Treatment) HPI   Patient is an 81 year old female with end-stage cancer. Her last oncology note stated there was no further treatment they could offer this time. Patient had  a weeklong hospitalization at [facility] last week. During that hospitalization she was treated for antibiotics for a cavitary lesion secondary to cancer in her lungs. Patient was discharged home with Augmentin and discontinued it 2 days ago. Patient went to follow up with her primary care physician and was found to have an elevated white count.  Primary care physician sent her here to the emergency department. Patient's had increasing cough and sputum production. No fevers. No nausea no vomiting.no diarrhea.     Past Medical History  Diagnosis Date  . Throat cancer ([place]) 2010  . Breast cancer ([place]) 2010    left   . Lung cancer ([place]) 2012  . Breast cancer ([place]) 2013    right  . Hypothyroidism   . HTN (hypertension)    Past Surgical History  Procedure Laterality Date  . Mastectomy Bilateral     2010, 2013  . Mastectomy  bilateral  . Back surgery    . Portacath placement    . Port-a-cath removal    . Cholecystectomy N/A 01/28/2014    Procedure: LAPAROSCOPIC CHOLECYSTECTOMY;  Surgeon: Rolm Bookbinder, MD;  Location: [facility] OR;  Service: General;  Laterality: N/A;   Family History  Problem Relation Age of Onset  . Diabetes Mother   . Arthritis Father    Social History  Substance Use Topics  . Smoking status: Never Smoker   . Smokeless tobacco: Never Used  . Alcohol Use: No   OB History    No data available     Review of Systems  Constitutional: Negative for fever, activity change and fatigue.  HENT: Negative for congestion  and drooling.   Eyes: Negative for discharge.  Respiratory: Positive for cough and shortness of breath. Negative for chest tightness.   Cardiovascular: Negative for chest pain.  Gastrointestinal: Negative for abdominal distention.  Genitourinary: Negative for dysuria.  Musculoskeletal: Negative for joint swelling.  Skin: Negative for rash.  Allergic/Immunologic: Negative for immunocompromised state.  Neurological: Negative for dizziness and speech difficulty.  Psychiatric/Behavioral: Negative for agitation.      Allergies  Review of patient's allergies indicates no known allergies.  Home Medications   Prior to Admission medications   Medication Sig Start Date End Date Taking? Authorizing Provider  acetaminophen (TYLENOL) 325 MG tablet Take 650 mg by mouth 2 (two) times daily. Take 1 tablet with oxycodone 2x a day   Yes Historical Provider, MD  alendronate (FOSAMAX) 35 MG tablet TAKE 1 TABLET BY MOUTH EVERY 7 DAYS. TAKE WITH A FULL GLASS OF WATER ON AN EMPTY STOMACH Patient taking differently: TAKE 1 TABLET BY MOUTH EVERY 7 DAYS. TAKE WITH A FULL GLASS OF WATER ON AN EMPTY STOMACH-Tuesday 03/31/15  Yes Nicholas Lose, MD  amoxicillin-clavulanate (AUGMENTIN) 875-125 MG tablet Take 1 tablet by mouth 2 (two) times daily. 11/02/15  Yes Oswald Hillock, MD  Cyanocobalamin (VITAMIN B 12 PO) Take 1 tablet by mouth daily.   Yes Historical Provider, MD  Doxylamine Succinate, Sleep, (SLEEP AID PO) Take 1 tablet by mouth at bedtime as needed (  sleep).   Yes Historical Provider, MD  feeding supplement, ENSURE ENLIVE, (ENSURE ENLIVE) LIQD Take 237 mLs by mouth 2 (two) times daily between meals. 11/02/15  Yes Oswald Hillock, MD  guaiFENesin (MUCINEX) 600 MG 12 hr tablet Take 2 tablets (1,200 mg total) by mouth 2 (two) times daily. 11/02/15  Yes Oswald Hillock, MD  ibuprofen (ADVIL,MOTRIN) 200 MG tablet Take 800 mg by mouth every 6 (six) hours as needed for moderate pain.    Yes Historical Provider, MD   ipratropium-albuterol (DUONEB) 0.5-2.5 (3) MG/3ML SOLN Take 3 mLs by nebulization 3 (three) times daily. For 1 week and then as needed every 12 hours 11/03/15 11/10/15 Yes Lavina Hamman, MD  letrozole [facility]) 2.5 MG tablet TAKE 1 TABLET (2.5 MG TOTAL) BY MOUTH DAILY. 10/07/15  Yes Nicholas Lose, MD  levothyroxine (SYNTHROID, LEVOTHROID) 100 MCG tablet TAKE 1 TABLET (100 MCG TOTAL) BY MOUTH DAILY BEFORE BREAKFAST. 04/02/15  Yes Lucille Passy, MD  oxyCODONE (OXY IR/ROXICODONE) 5 MG immediate release tablet Take 1 tablet (5 mg total) by mouth 2 (two) times daily as needed for severe pain. 11/09/15  Yes Lucille Passy, MD  pilocarpine (SALAGEN) 5 MG tablet TAKE 1 TABLET (5 MG TOTAL) BY MOUTH EVERY 12 (TWELVE) HOURS. Patient taking differently: TAKE 1 TABLET (5 MG TOTAL) BY MOUTH DAILY 09/21/15  Yes Lucille Passy, MD  Rivaroxaban (XARELTO STARTER PACK) 15 & 20 MG TBPK Take as directed on package: Start with one '15mg'$  tablet by mouth twice a day with food. On Day 22, switch to one '20mg'$  tablet once a day with food. 11/02/15  Yes Oswald Hillock, MD  cetirizine (ZYRTEC) 5 MG tablet Take 1 tablet (5 mg total) by mouth daily. 03/09/15   Lucille Passy, MD   BP 143/70 mmHg  Pulse 84  Temp(Src) 98 F (36.7 C) (Oral)  Resp 15  SpO2 98% Physical Exam  Constitutional: She appears well-developed and well-nourished.  Patient cachectic pale  HENT:  Head: Normocephalic and atraumatic.  Eyes: Conjunctivae are normal. Right eye exhibits no discharge.  Neck: Neck supple.  Cardiovascular: Normal rate, regular rhythm and normal heart sounds.   No murmur heard. Pulmonary/Chest: Effort normal. She has wheezes.  Decreased breath sounds on the right.  Abdominal: Soft. There is no tenderness.  Musculoskeletal: Normal range of motion. She exhibits no edema.  Neurological: No cranial nerve deficit.  Skin: Skin is warm and dry. No rash noted. She is not diaphoretic.  Nursing note and vitals reviewed.   ED Course  Procedures  (including critical care time) Labs Review Labs Reviewed  CBC WITH DIFFERENTIAL/PLATELET - Abnormal; Notable for the following:    WBC 14.1 (*)    RBC 3.25 (*)    Hemoglobin 9.7 (*)    HCT 29.6 (*)    Platelets 429 (*)    Neutro Abs 11.8 (*)    All other components within normal limits  COMPREHENSIVE METABOLIC PANEL - Abnormal; Notable for the following:    Chloride 97 (*)    Glucose, Bld 105 (*)    BUN 21 (*)    Calcium 8.8 (*)    Albumin 2.6 (*)    All other components within normal limits  URINALYSIS, ROUTINE W REFLEX MICROSCOPIC (NOT AT [facility]) - Abnormal; Notable for the following:    Hgb urine dipstick TRACE (*)    Protein, ur 30 (*)    Leukocytes, UA TRACE (*)    All other components within normal limits  URINE MICROSCOPIC-ADD ON - Abnormal; Notable for the following:    Squamous Epithelial / LPF 0-5 (*)    All other components within normal limits  URINE CULTURE  LIPASE, BLOOD  I-STAT CG4 LACTIC ACID, ED  Randolm Idol, ED    Imaging Review Dg Chest 2 View  11/09/2015  CLINICAL DATA:  Acute onset of leukocytosis and cough. Initial encounter. 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 This is grossly unchanged from the recent prior CTA. Minimal peripheral fibrotic change is noted at the left lung. The cardiomediastinal silhouette is normal in size, though difficult to fully assess due to the right-sided pleural effusion. No acute osseous abnormalities are seen. Scattered clips are noted at the left axilla. There is chronic compression deformity at the lower thoracic spine, with associated changes of vertebroplasty. IMPRESSION: 1. Diffuse right-sided fibrothorax again noted, with widespread bronchiectasis, prominent cystic change and associated  parapneumonic pleural effusion. This is grossly unchanged from the recent prior CTA. As before, acute cavitary pneumonia is a concern. Would correlate clinically. 2. Minimal peripheral fibrotic change at the left lung. Electronically Signed   By: Garald Balding M.D.   On: 11/09/2015 18:51   I have personally reviewed and evaluated these images and lab results as part of my medical decision-making.   EKG Interpretation None      MDM   Final diagnoses:  None   patient is a 81 year old female with end-stage cancer presenting today with elevated white count been sent from her primary care physician. The first thing he did when talking to patient and patient's family's was clarify what their goals of care were. Patient and family had not thought about the goals of care. They said the patient wants to be home as much as possible. I asked what it was that they wanted to be done here in the emergency department for this white blood cell count. Would they want admission? Or an IV.  Patient's family considered for a while without me.  They decided to do initial workup. Then they will decide once we find out what's causing white count whether they would like admission or not.  I spent greater than half an hour talking about end-of-life care and palliative care with this family.  8:46 PM  The white count was likely erroneous given that it is dropped from 23-14. It was 14 when she left the hospital given no new fever. Will not change any of her antibiotics right now. We'll have her follow-up with her primary care physician. Chest x-ray and urine are negative for any infection. We will have her follow up with primary care.  Palliative care told about patient.   Sora Vrooman Julio Alm, MD 11/09/15 2048

## 2015-11-09 NOTE — ED Notes (Addendum)
Pt sent from pcp office. Pt was seen and admitted to hospital on 11/4, for similar complaint. Pt currently has metastatic breast cancer to lungs, pt over last month has presented with worsening SOB over past month along with hemoptysis and weakness. Was treated for PNA, sent home on Augmentin and xarelto for PE. Pt had a follow up with pcp today, had blood work drawn, WBC 21.3    Dr. Sonny Dandy is oncologist no current treatment planned

## 2015-11-09 NOTE — Telephone Encounter (Signed)
Critical lab results- WBC 21.3, hard copy of results given to [facility]. Results routed to both [facility], and Dr. Deborra Medina.

## 2015-11-09 NOTE — Assessment & Plan Note (Signed)
Continue Xarelto. They are aware when to change dose from starter pack. Has follow up scheduled with oncology as well.

## 2015-11-09 NOTE — ED Notes (Signed)
Pt to xray

## 2015-11-09 NOTE — Assessment & Plan Note (Signed)
Symptoms improving, lung exam reassuring. Recheck CBC and CMET today. Finish last dose of Augmentin today. Call or return to clinic prn if these symptoms worsen or fail to improve as anticipated. The patient indicates understanding of these issues and agrees with the plan.

## 2015-11-09 NOTE — Discharge Instructions (Signed)
[facility] care is an out patient group that may be beneficial.   We also called our Palliative group and they will follow up with you.

## 2015-11-09 NOTE — Assessment & Plan Note (Signed)
Has declined hospice.

## 2015-11-09 NOTE — Telephone Encounter (Signed)
Spoke to pt (and her daughter, at the request of the pt) and advised them of instruction. Veronica Pierce will take Rhyann to ED

## 2015-11-10 NOTE — Telephone Encounter (Signed)
Lm on pts vm requesting a call back with update

## 2015-11-10 NOTE — Telephone Encounter (Signed)
Please call to check on pt today.

## 2015-11-11 ENCOUNTER — Ambulatory Visit: Payer: Medicare Other | Admitting: Family Medicine

## 2015-11-11 ENCOUNTER — Telehealth: Payer: Self-pay

## 2015-11-11 LAB — URINE CULTURE: CULTURE: NO GROWTH

## 2015-11-11 NOTE — Telephone Encounter (Signed)
Veronica Pierce pts daughter (DPR signed) said pt was seen in [facility] ED on 11/09/15 for elevated WBC; per AVS instructions for pt to be seen 1 day by PCP; Veronica Pierce scheduled appt with Webb Silversmith NP on 11/12/15 at 2 pm for 30 min appt. Veronica Pierce said pt doing OK (pt cleaning oven now).

## 2015-11-12 ENCOUNTER — Encounter: Payer: Self-pay | Admitting: Internal Medicine

## 2015-11-12 ENCOUNTER — Ambulatory Visit (INDEPENDENT_AMBULATORY_CARE_PROVIDER_SITE_OTHER): Payer: Medicare Other | Admitting: Internal Medicine

## 2015-11-12 VITALS — BP 120/54 | HR 97 | Temp 98.0°F | Wt 90.0 lb

## 2015-11-12 DIAGNOSIS — I2699 Other pulmonary embolism without acute cor pulmonale: Secondary | ICD-10-CM | POA: Diagnosis not present

## 2015-11-12 DIAGNOSIS — C50919 Malignant neoplasm of unspecified site of unspecified female breast: Secondary | ICD-10-CM | POA: Diagnosis not present

## 2015-11-12 DIAGNOSIS — C7800 Secondary malignant neoplasm of unspecified lung: Secondary | ICD-10-CM

## 2015-11-12 DIAGNOSIS — J189 Pneumonia, unspecified organism: Secondary | ICD-10-CM | POA: Diagnosis not present

## 2015-11-12 DIAGNOSIS — D72829 Elevated white blood cell count, unspecified: Secondary | ICD-10-CM | POA: Diagnosis not present

## 2015-11-12 DIAGNOSIS — J984 Other disorders of lung: Secondary | ICD-10-CM

## 2015-11-12 NOTE — Progress Notes (Signed)
Pre visit review using our clinic review tool, if applicable. No additional management support is needed unless otherwise documented below in the visit note. 

## 2015-11-12 NOTE — Patient Instructions (Signed)
White Blood Cell Count Test Your white blood cells (leukocytes) are the cells in your blood that help you fight infection. They are part of your body's defense system (immune system). When the immune system needs more white blood cells, the tissue inside your bones (marrow) makes more and releases them into your blood. They travel through your bloodstream to areas of the body where they are needed. A white blood cell count measures how many white blood cells you have. Different types of white blood cells make up your total count. These include:  Lymphocytes.  Neutrophils.  Basophils.  Monocytes.  Eosinophils. Your white blood cell count includes a breakdown of how many of each of these cells you have. You may have this test as part of a routine checkup that includes a complete blood count to measure all the cells in your blood. You may also have this test if you have any signs or symptoms of:  Illness.  Infection.  An allergic reaction.  Inflammation, which is swelling and irritation anywhere in your body. Your white blood cells play a major role in the process of inflammation. This test requires a blood sample taken from a vein in your hand or arm. PREPARATION FOR TEST Let your health care provider know if:  You have any medical conditions. Some conditions can affect your white blood cell count.  You are taking any medicines. This includes over-the-counter medicines, vitamins, and supplements. Some medicines can cause your count to go up or down.  You are pregnant.  You have had your spleen removed.  You have had any recent physical or emotional stress. RESULTS It is your responsibility to obtain your test results. Ask the lab or department performing the test when and how you will get your results. Contact your health care provider to discuss any questions you have about your results.  The results of this test will be a numerical value for each type of white blood cell and a  total count of all the white blood cells in your sample. Total white blood cell count is measured in number of cells per cubic millimeter. Range of Normal Values Ranges for normal values may vary among different labs and hospitals. You should always check with your health care provider after having lab work or other tests done to discuss whether your values are considered within normal limits. The normal ranges for total white blood cell count are:  Adults and children over 79 years of age: 33,000-10,000 per cubic millimeter.  Children age 20 years or younger: 6,200-17,000 per cubic millimeter.  Newborn: 9,000-30,000 per cubic millimeter. Meaning of Results Outside Normal Value Ranges A low white blood cell count is called leukopenia. Common causes include:  Severe bacterial infections.  Some viral infections.  Bone marrow disease.  Immune system disease.  Cancer or cancer treatments.  Medicines. A high white blood cell count is called leukocytosis. Common causes include:  Bacterial and other infections.  White blood cell cancers.  Trauma.  Allergic reactions.  Diseases that cause inflammation.  Stress.  Medicines. Many factors can cause your white blood cell count to be outside the normal range. Discuss your results with your health care provider. You may need to have additional tests.   This information is not intended to replace advice given to you by your health care provider. Make sure you discuss any questions you have with your health care provider.   Document Released: 01/05/2005 Document Revised: 01/01/2015 Document Reviewed: 04/01/2014 Elsevier Interactive Patient Education  2016 [employer].

## 2015-11-12 NOTE — Progress Notes (Signed)
Subjective:    Patient ID: Veronica Pierce, female    DOB: 20-Feb-1931, 81 y.o.   MRN: [id number]  HPI  Pt presents to the clinic today for ER followup of elevated WBC. She saw Dr. Deborra Medina 11/15 for hospital follow up for cavitary pneumonia. She had already finished her oral antibiotics at that time. Dr. Deborra Medina checked her CBC at the office visit, it was 21 up from 14. Dr. Deborra Medina advised her to go to the ER for further evaluation. They checked her white count and it was back down to 14. Chest xray was negative for recurrent infection. Given that she had no fever and that white count was stable, they discharged her and advised her to follow up with her PCP.  Since the ER visit, she reports she has not run any fevers. She is SOB with exertion but not at rest (recent PNA and PE, on Xarelto as well as history of metastatic breast cancer, now involving lung)). She has had a mild cough, worse in the morning, productive of clear blood, streaked mucous. She is taking Mucinex BID for this. She has a f/u appt with oncology 12/2015.  Review of Systems      Past Medical History  Diagnosis Date  . Throat cancer ([place]) 2010  . Breast cancer ([place]) 2010    left   . Lung cancer ([place]) 2012  . Breast cancer ([place]) 2013    right  . Hypothyroidism   . HTN (hypertension)     Current Outpatient Prescriptions  Medication Sig Dispense Refill  . acetaminophen (TYLENOL) 325 MG tablet Take 650 mg by mouth 2 (two) times daily. Take 1 tablet with oxycodone 2x a day    . alendronate (FOSAMAX) 35 MG tablet TAKE 1 TABLET BY MOUTH EVERY 7 DAYS. TAKE WITH A FULL GLASS OF WATER ON AN EMPTY STOMACH (Patient taking differently: TAKE 1 TABLET BY MOUTH EVERY 7 DAYS. TAKE WITH A FULL GLASS OF WATER ON AN EMPTY STOMACH-Tuesday) 48 tablet 0  . cetirizine (ZYRTEC) 5 MG tablet Take 1 tablet (5 mg total) by mouth daily. 30 tablet   . Cyanocobalamin (VITAMIN B 12 PO) Take 1 tablet by mouth daily.    . Doxylamine Succinate, Sleep, (SLEEP AID  PO) Take 1 tablet by mouth at bedtime as needed (sleep).    . feeding supplement, ENSURE ENLIVE, (ENSURE ENLIVE) LIQD Take 237 mLs by mouth 2 (two) times daily between meals. 237 mL 12  . guaiFENesin (MUCINEX) 600 MG 12 hr tablet Take 2 tablets (1,200 mg total) by mouth 2 (two) times daily. 20 tablet 0  . ibuprofen (ADVIL,MOTRIN) 200 MG tablet Take 800 mg by mouth every 6 (six) hours as needed for moderate pain.     Marland Kitchen letrozole (FEMARA) 2.5 MG tablet TAKE 1 TABLET (2.5 MG TOTAL) BY MOUTH DAILY. 90 tablet 3  . levothyroxine (SYNTHROID, LEVOTHROID) 100 MCG tablet TAKE 1 TABLET (100 MCG TOTAL) BY MOUTH DAILY BEFORE BREAKFAST. 30 tablet 11  . oxyCODONE (OXY IR/ROXICODONE) 5 MG immediate release tablet Take 1 tablet (5 mg total) by mouth 2 (two) times daily as needed for severe pain. 60 tablet 0  . pilocarpine (SALAGEN) 5 MG tablet TAKE 1 TABLET (5 MG TOTAL) BY MOUTH EVERY 12 (TWELVE) HOURS. (Patient taking differently: TAKE 1 TABLET (5 MG TOTAL) BY MOUTH DAILY) 60 tablet 1  . Rivaroxaban (XARELTO STARTER PACK) 15 & 20 MG TBPK Take as directed on package: Start with one '15mg'$  tablet by mouth twice a day  with food. On Day 22, switch to one '20mg'$  tablet once a day with food. 51 each 2   No current facility-administered medications for this visit.    No Known Allergies  Family History  Problem Relation Age of Onset  . Diabetes Mother   . Arthritis Father     Social History   Social History  . Marital Status: Married    Spouse Name: N/A  . Number of Children: N/A  . Years of Education: N/A   Occupational History  . Not on file.   Social History Main Topics  . Smoking status: Never Smoker   . Smokeless tobacco: Never Used  . Alcohol Use: No  . Drug Use: No  . Sexual Activity: Not Currently   Other Topics Concern  . Not on file   Social History Narrative   Recently moved with here with husband to [facility].   Daughter lives here and is my patient.     Constitutional: Pt reports  fatigue. Denies fever, malaise, headache or abrupt weight changes.  HEENT: Denies eye pain, eye redness, ear pain, ringing in the ears, wax buildup, runny nose, nasal congestion, bloody nose, or sore throat. Respiratory: Pt reports cough and shortness of breath. Denies difficulty breathing.   Cardiovascular: Denies chest pain, chest tightness, palpitations or swelling in the hands or feet.   No other specific complaints in a complete review of systems (except as listed in HPI above).  Objective:   Physical Exam  BP 120/54 mmHg  Pulse 97  Temp(Src) 98 F (36.7 C) (Oral)  Wt 90 lb (40.824 kg)  SpO2 97% Wt Readings from Last 3 Encounters:  11/12/15 90 lb (40.824 kg)  11/09/15 89 lb 12 oz (40.71 kg)  10/29/15 93 lb 4.1 oz (42.3 kg)    General: Appears her stated age, in NAD. Cardiovascular: Normal rate and rhythm. S1,S2 noted.   Pulmonary/Chest: Normal effort and diminished on the right. No respiratory distress.  Neurological: Alert and oriented.   BMET    Component Value Date/Time   NA 135 11/09/2015 1803   NA 139 01/19/2015 1032   K 4.3 11/09/2015 1803   K 4.3 01/19/2015 1032   CL 97* 11/09/2015 1803   CO2 28 11/09/2015 1803   CO2 28 01/19/2015 1032   GLUCOSE 105* 11/09/2015 1803   GLUCOSE 101 01/19/2015 1032   BUN 21* 11/09/2015 1803   BUN 16.6 01/19/2015 1032   CREATININE 0.72 11/09/2015 1803   CREATININE 0.8 01/19/2015 1032   CALCIUM 8.8* 11/09/2015 1803   CALCIUM 9.3 01/19/2015 1032   GFRNONAA >60 11/09/2015 1803   GFRAA >60 11/09/2015 1803    Lipid Panel  No results found for: CHOL, TRIG, HDL, CHOLHDL, VLDL, LDLCALC  CBC    Component Value Date/Time   WBC 14.1* 11/09/2015 1803   WBC 8.9 01/19/2015 1032   RBC 3.25* 11/09/2015 1803   RBC 4.18 01/19/2015 1032   HGB 9.7* 11/09/2015 1803   HGB 12.9 01/19/2015 1032   HCT 29.6* 11/09/2015 1803   HCT 40.1 01/19/2015 1032   PLT 429* 11/09/2015 1803   PLT 200 01/19/2015 1032   MCV 91.1 11/09/2015 1803   MCV  95.9 01/19/2015 1032   MCH 29.8 11/09/2015 1803   MCH 30.9 01/19/2015 1032   MCHC 32.8 11/09/2015 1803   MCHC 32.2 01/19/2015 1032   RDW 14.0 11/09/2015 1803   RDW 14.2 01/19/2015 1032   LYMPHSABS 0.9 11/09/2015 1803   LYMPHSABS 1.2 01/19/2015 1032   MONOABS  1.0 11/09/2015 1803   MONOABS 0.8 01/19/2015 1032   EOSABS 0.3 11/09/2015 1803   EOSABS 0.2 01/19/2015 1032   BASOSABS 0.1 11/09/2015 1803   BASOSABS 0.0 01/19/2015 1032    Hgb A1C No results found for: HGBA1C       Assessment & Plan:   ER followup for elevated WBC (recent pneumonia, PE and metastatic breast CA):  ER notes, labs and imaging reviewed WBC in ER back down to 14 Pt is no longer receiving any cancer treatment- goals of care are comfort No indication to repeat CBC today, no fever, no increased SOB She will continue current medications at this time Discussed watching for s/s of bleeding while on Xarelto Watch for fever, increased SOB or productive cough  RTC as needed or if symptoms persist or worsen

## 2015-11-15 ENCOUNTER — Telehealth: Payer: Self-pay | Admitting: *Deleted

## 2015-11-15 NOTE — Telephone Encounter (Signed)
Received call from [facility] for DNR order. Discussed with Dr. Lindi Adie, called and spoke with Abigail Butts for DNR order and ok for Hospice MD to sign.

## 2015-11-16 ENCOUNTER — Other Ambulatory Visit: Payer: Self-pay | Admitting: Family Medicine

## 2015-11-23 ENCOUNTER — Other Ambulatory Visit: Payer: Self-pay

## 2015-11-23 DIAGNOSIS — C50912 Malignant neoplasm of unspecified site of left female breast: Secondary | ICD-10-CM

## 2015-11-23 DIAGNOSIS — C50919 Malignant neoplasm of unspecified site of unspecified female breast: Secondary | ICD-10-CM

## 2015-11-23 DIAGNOSIS — C50911 Malignant neoplasm of unspecified site of right female breast: Secondary | ICD-10-CM

## 2015-11-23 DIAGNOSIS — C140 Malignant neoplasm of pharynx, unspecified: Secondary | ICD-10-CM

## 2015-11-23 MED ORDER — RIVAROXABAN 20 MG PO TABS: 20.0000 mg | ORAL_TABLET | Freq: Every day | ORAL | Status: DC

## 2015-12-12 IMAGING — CT CT ANGIO CHEST
2 of 6 series · 17 of 36 positions shown · IV contrast (omnipaque)
Comparison: PET-CT 01/11/2015.

CLINICAL DATA: 84-year-old female with increasing shortness of
breath and weakness. History of left-sided breast cancer diagnosed
in 1050 and lung cancer diagnosed 1551, with recurrent right-sided
breast cancer diagnosed again in 9560.

EXAM:
CT ANGIOGRAPHY CHEST WITH CONTRAST
TECHNIQUE: Multidetector CT imaging of the chest was performed using the
standard protocol during bolus administration of intravenous
contrast. Multiplanar CT image reconstructions and MIPs were
obtained to evaluate the vascular anatomy.
CONTRAST:  100mL OMNIPAQUE IOHEXOL 350 MG/ML SOLN

[Series 5: coronal mpr · coronal · 0.56mm/px · 1 of 94 slices shown]
[im 47/94  mediastinal]
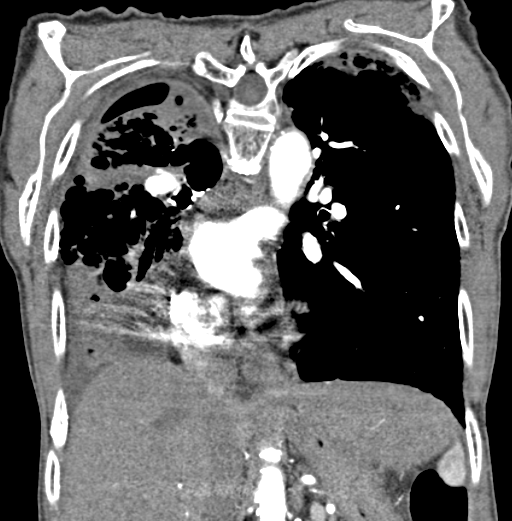

[Series 10: thins for pacs · axial · 0.61mm/px · z∈[+1261,+1524]mm · 16 of 293 slices shown]
[im 15/293  lung]
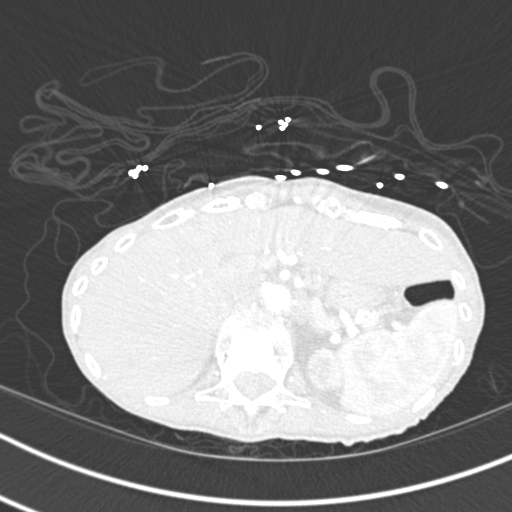
[im 30/293  mediastinal]
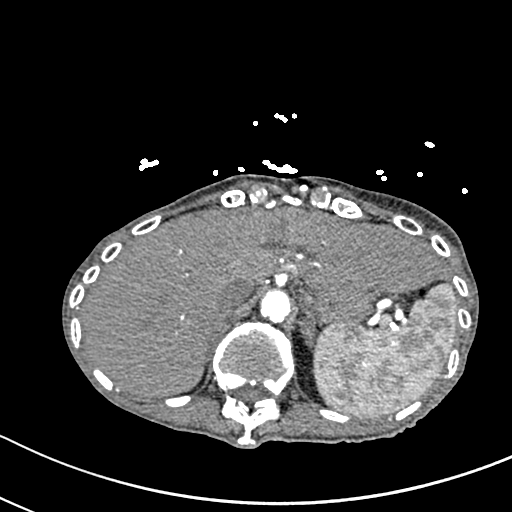
[im 44/293  lung]
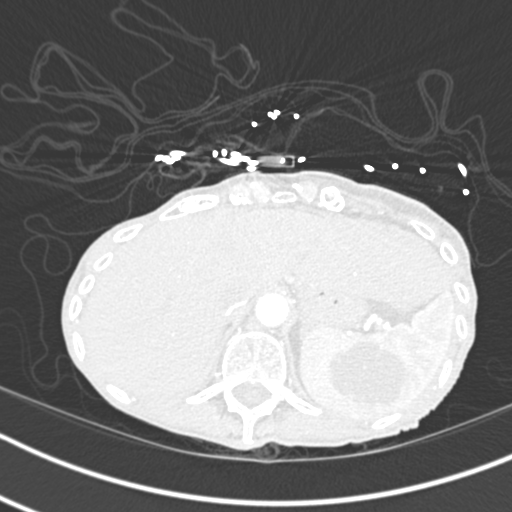
[im 74/293  mediastinal]
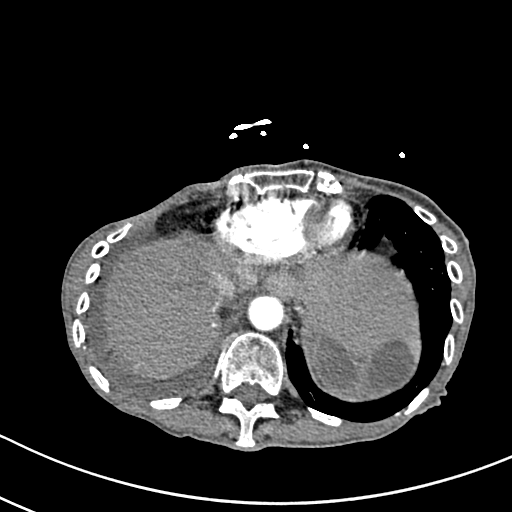
[im 88/293  lung]
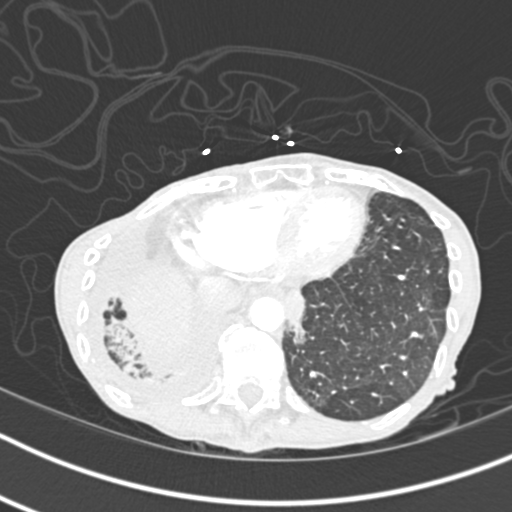
[im 103/293  mediastinal]
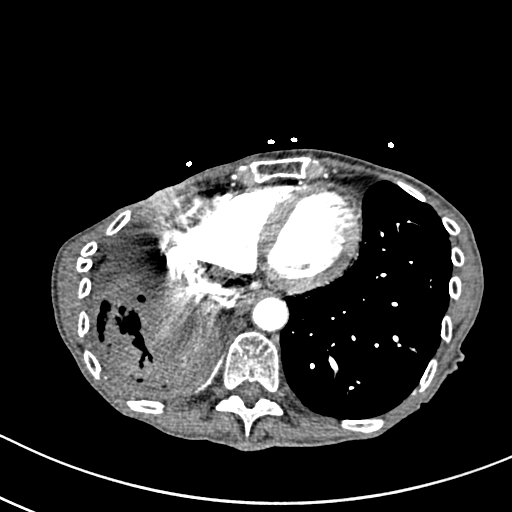
[im 117/293  lung]
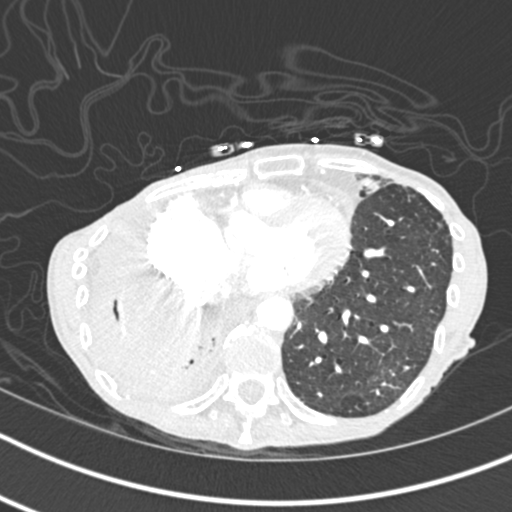
[im 132/293  mediastinal]
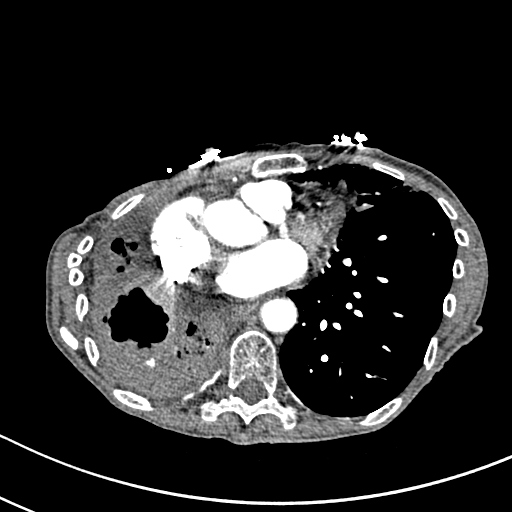
[im 161/293  lung]
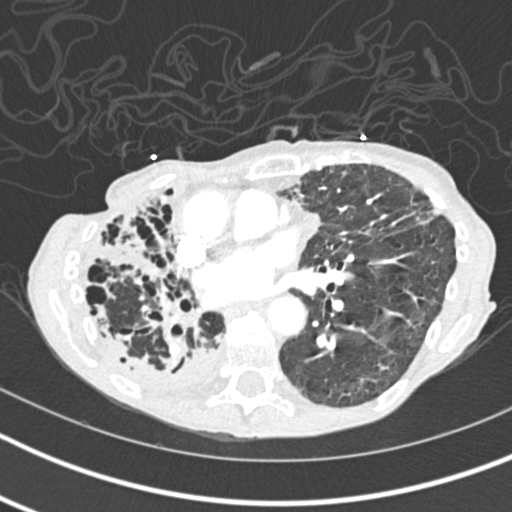
[im 176/293  mediastinal]
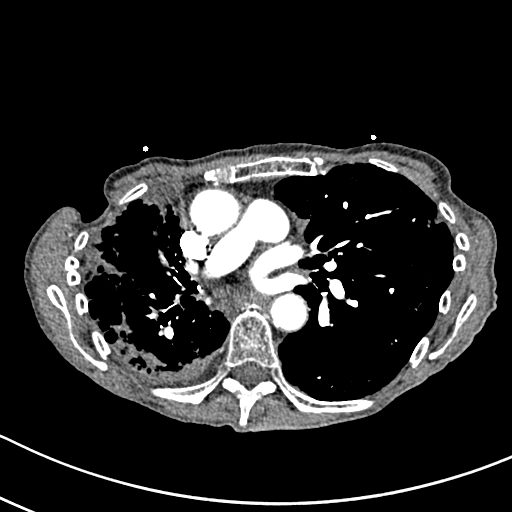
[im 190/293  lung]
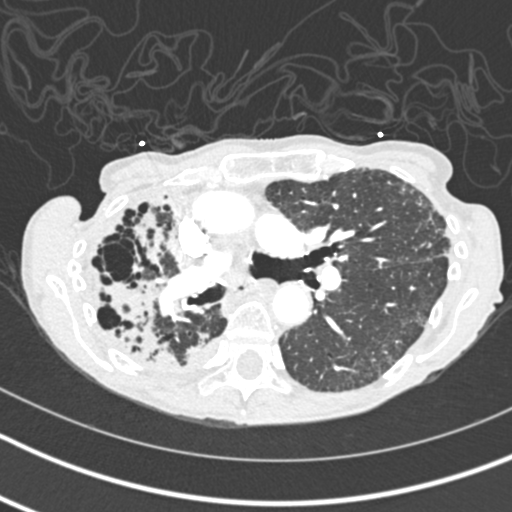
[im 205/293  mediastinal]
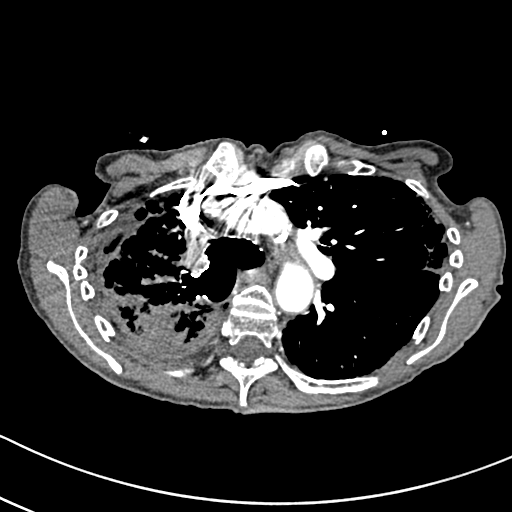
[im 220/293  lung]
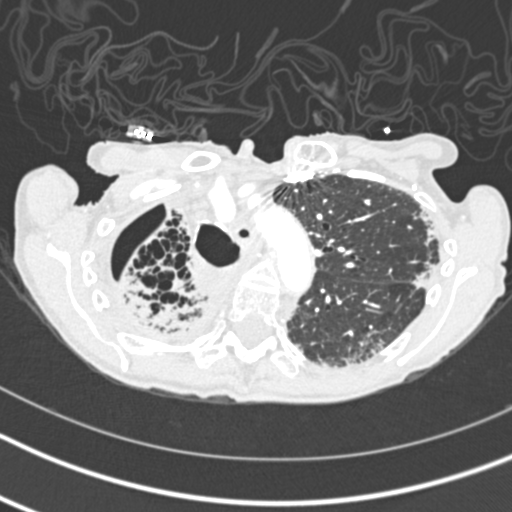
[im 249/293  mediastinal]
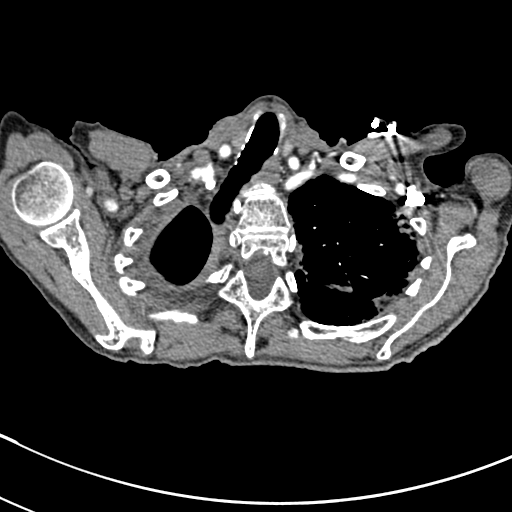
[im 263/293  lung]
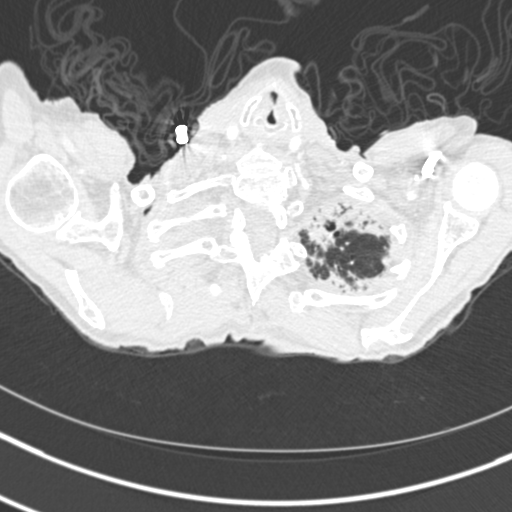
[im 278/293  mediastinal]
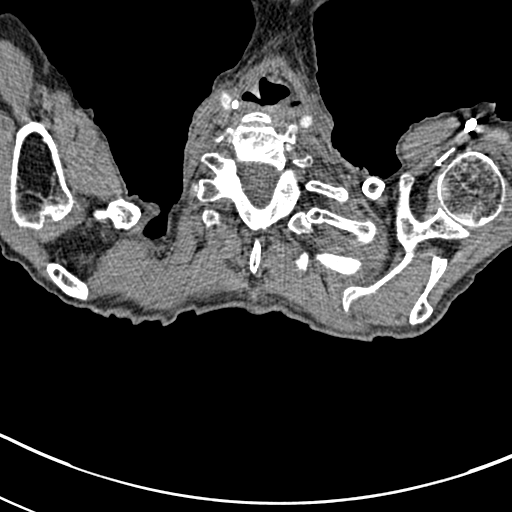

[17 of 36 positions shown; findings below may reference images not displayed]

FINDINGS: Mediastinum/Lymph Nodes: Image 88 of series 10 demonstrates a
nonocclusive filling defect in a segmental sized branch to the left
upper lobe. No other larger more central filling defects are noted.
Heart size is normal. There is no significant pericardial fluid,
thickening or pericardial calcification. Enlarged subcarinal lymph
node measuring 1.4 cm in short axis. Esophagus is unremarkable in
appearance. Slight rightward shift of all cardiomediastinal
structures related to chronic right-sided volume loss. No axillary
lymphadenopathy. Numerous surgical clips in the left axilla,
presumably from prior lymph node dissection.

Lungs/Pleura: Severe chronic fibrotic changes again noted throughout
the right lung where there is widespread varicose and cystic
bronchiectasis. The overall appearance of the right hemithorax is
most compatible with chronic fibrothorax, including the presence of
a chronic pneumothorax which is notable in the right apex, only
slightly larger than remote prior study 01/11/2015. Extensive
consolidative changes in the right lower lobe are noted, where there
is a new large cavitary area measuring 4.2 x 3.9 cm (image 53 of
series 11). Small right pleural effusion. Fibrotic changes in the
apex of the left upper lobe and anterior aspect of the left upper
lobe, presumably related to prior left-sided breast and axilla
radiation therapy. Pleural-based sessile nodule in the medial aspect
of the left lower lobe (image 70 of series 4) new compared to the
prior study, measuring 2.9 x 1.2 cm.

Upper Abdomen: Large 3.9 x 6.8 cm low-attenuation lesion in the
superior aspect of the spleen appears progressively increased in
size compared to prior examinations, suspicious for a cystic
metastasis.

Musculoskeletal/Soft Tissues: Chronic compression fracture of L1
with post vertebroplasty changes. Old healed fractures of the
lateral aspect of the right seventh and eighth ribs. There are no
aggressive appearing lytic or blastic lesions noted in the
visualized portions of the skeleton.

Review of the MIP images confirms the above findings.
IMPRESSION: 1. Small nonobstructive segmental sized embolus to the left upper
lobe.
2. Progressively worsening right-sided fibrothorax with widespread
varicose and cystic bronchiectasis throughout the right lung,
including a new large 4.2 x 3.9 cm cystic area in the right lower
lobe in the midst of an area of consolidation. Clinical correlation
for signs and symptoms of acute cavitary pneumonia is recommended.
There is also small right-sided parapneumonic pleural effusion.
3. New sessile pleural-based nodule measuring 2.9 x 1.2 cm in the
medial left lower lobe, concerning for potential neoplasm (either
primary neoplasm or metastatic).
4. Enlarging cystic lesion in the spleen concerning for potential
cystic splenic metastasis.
5. Additional incidental findings, as above.

## 2015-12-14 ENCOUNTER — Telehealth: Payer: Self-pay | Admitting: *Deleted

## 2015-12-14 ENCOUNTER — Other Ambulatory Visit: Payer: Self-pay | Admitting: *Deleted

## 2015-12-14 DIAGNOSIS — C50919 Malignant neoplasm of unspecified site of unspecified female breast: Secondary | ICD-10-CM

## 2015-12-14 MED ORDER — OXYCODONE HCL 5 MG PO TABS: 5.0000 mg | ORAL_TABLET | Freq: Two times a day (BID) | ORAL | Status: DC | PRN

## 2015-12-14 NOTE — Telephone Encounter (Signed)
Faxed rx for oxycodone to [place]

## 2015-12-16 ENCOUNTER — Encounter: Payer: Self-pay | Admitting: *Deleted

## 2015-12-16 NOTE — Progress Notes (Signed)
Received notes from [facility], reviewed by Dr. Lindi Adie, sent to scan.

## 2015-12-23 ENCOUNTER — Encounter: Payer: Self-pay | Admitting: *Deleted

## 2015-12-23 NOTE — Progress Notes (Signed)
Received office notes from [facility], reviewed by Dr. Lindi Adie, sent to scan.

## 2015-12-31 ENCOUNTER — Telehealth: Payer: Self-pay | Admitting: Hematology and Oncology

## 2015-12-31 NOTE — Telephone Encounter (Signed)
Called in and left a message to cancel appointments as she is on hospice now    anne

## 2016-01-10 ENCOUNTER — Other Ambulatory Visit: Payer: Self-pay | Admitting: *Deleted

## 2016-01-10 ENCOUNTER — Telehealth: Payer: Self-pay | Admitting: *Deleted

## 2016-01-10 DIAGNOSIS — C50919 Malignant neoplasm of unspecified site of unspecified female breast: Secondary | ICD-10-CM

## 2016-01-10 MED ORDER — OXYCODONE HCL 5 MG PO TABS: 5.0000 mg | ORAL_TABLET | Freq: Two times a day (BID) | ORAL | Status: DC | PRN

## 2016-01-10 NOTE — Telephone Encounter (Signed)
Megan RN with [facility] called "requesting pain medicine refill.  Oxycodone 5 mg needed should be faxed to [facility]."

## 2016-01-10 NOTE — Telephone Encounter (Signed)
Prescription faxed

## 2016-01-11 ENCOUNTER — Other Ambulatory Visit: Payer: Medicare Other

## 2016-01-11 ENCOUNTER — Ambulatory Visit: Payer: Medicare Other | Admitting: Hematology and Oncology

## 2016-01-21 ENCOUNTER — Other Ambulatory Visit: Payer: Self-pay

## 2016-01-21 DIAGNOSIS — C50919 Malignant neoplasm of unspecified site of unspecified female breast: Secondary | ICD-10-CM

## 2016-01-21 MED ORDER — IPRATROPIUM-ALBUTEROL 20-100 MCG/ACT IN AERS: 1.0000 | INHALATION_SPRAY | Freq: Four times a day (QID) | RESPIRATORY_TRACT | Status: AC

## 2016-01-27 ENCOUNTER — Telehealth: Payer: Self-pay | Admitting: *Deleted

## 2016-01-27 NOTE — Telephone Encounter (Signed)
[facility]osp[facility] called requesting refill on Oxycodone for this patient.  Message to collaborative [phone].

## 2016-01-28 ENCOUNTER — Other Ambulatory Visit: Payer: Self-pay

## 2016-01-28 DIAGNOSIS — C50919 Malignant neoplasm of unspecified site of unspecified female breast: Secondary | ICD-10-CM

## 2016-01-28 MED ORDER — OXYCODONE HCL 5 MG PO TABS: 5.0000 mg | ORAL_TABLET | Freq: Four times a day (QID) | ORAL | Status: AC | PRN

## 2016-02-04 ENCOUNTER — Encounter: Payer: Self-pay | Admitting: *Deleted

## 2016-02-04 NOTE — Progress Notes (Signed)
Received office notes from [facility], reviewed by Dr. Lindi Adie, sent to scan.

## 2016-02-09 ENCOUNTER — Other Ambulatory Visit: Payer: Self-pay

## 2016-02-09 MED ORDER — GUAIFENESIN ER 600 MG PO TB12
1200.0000 mg | ORAL_TABLET | Freq: Two times a day (BID) | ORAL | Status: DC
Start: 2016-02-09 — End: 2016-02-29

## 2016-02-09 MED ORDER — IPRATROPIUM-ALBUTEROL 0.5-2.5 (3) MG/3ML IN SOLN: 3.0000 mL | Freq: Four times a day (QID) | RESPIRATORY_TRACT | Status: AC | PRN

## 2016-02-09 MED ORDER — IBUPROFEN 200 MG PO TABS: 800.0000 mg | ORAL_TABLET | Freq: Four times a day (QID) | ORAL | Status: AC | PRN

## 2016-02-23 ENCOUNTER — Encounter: Payer: Self-pay | Admitting: *Deleted

## 2016-02-23 NOTE — Progress Notes (Signed)
Notes received from [facility], reviewed by Dr. Lindi Adie, sent to scan.

## 2016-02-29 ENCOUNTER — Other Ambulatory Visit: Payer: Self-pay | Admitting: *Deleted

## 2016-02-29 DIAGNOSIS — C50919 Malignant neoplasm of unspecified site of unspecified female breast: Secondary | ICD-10-CM

## 2016-02-29 MED ORDER — RIVAROXABAN 20 MG PO TABS: 20.0000 mg | ORAL_TABLET | Freq: Every day | ORAL | Status: AC

## 2016-02-29 MED ORDER — GUAIFENESIN ER 600 MG PO TB12: 1200.0000 mg | ORAL_TABLET | Freq: Two times a day (BID) | ORAL | Status: AC

## 2016-03-06 ENCOUNTER — Encounter: Payer: Self-pay | Admitting: *Deleted

## 2016-03-06 NOTE — Progress Notes (Signed)
Received office notes from [facility], reviewed by Dr. Lindi Adie, sent to scan.

## 2016-03-07 ENCOUNTER — Encounter: Payer: Self-pay | Admitting: *Deleted

## 2016-03-07 NOTE — Progress Notes (Signed)
Received office notes from [facility] and [facility], reviewed by Dr. Lindi Adie, sent to scan.

## 2016-04-13 ENCOUNTER — Telehealth: Payer: Self-pay | Admitting: Hematology and Oncology

## 2016-04-13 NOTE — Telephone Encounter (Signed)
Contacted [employer] regarding pick up of death certificate

## 2016-04-14 ENCOUNTER — Telehealth: Payer: Self-pay | Admitting: Hematology and Oncology

## 2016-04-14 NOTE — Telephone Encounter (Signed)
Death certificate received in [facility]. Patient status changed to deceased.

## 2016-04-24 DEATH — deceased
